# Patient Record
Sex: Male | Born: 1935 | Race: White | Hispanic: No | Marital: Married | Smoking: Former smoker
Health system: Southern US, Community
[De-identification: ages and names within clinical notes are randomized; demographics above are authoritative.]

## PROBLEM LIST (undated history)

## (undated) DIAGNOSIS — J309 Allergic rhinitis, unspecified: Secondary | ICD-10-CM

## (undated) DIAGNOSIS — Z720 Tobacco use: Secondary | ICD-10-CM

## (undated) DIAGNOSIS — K219 Gastro-esophageal reflux disease without esophagitis: Secondary | ICD-10-CM

## (undated) DIAGNOSIS — C801 Malignant (primary) neoplasm, unspecified: Secondary | ICD-10-CM

## (undated) DIAGNOSIS — J449 Chronic obstructive pulmonary disease, unspecified: Secondary | ICD-10-CM

## (undated) DIAGNOSIS — N419 Inflammatory disease of prostate, unspecified: Secondary | ICD-10-CM

## (undated) DIAGNOSIS — I2510 Atherosclerotic heart disease of native coronary artery without angina pectoris: Secondary | ICD-10-CM

## (undated) DIAGNOSIS — M1990 Unspecified osteoarthritis, unspecified site: Secondary | ICD-10-CM

## (undated) DIAGNOSIS — L408 Other psoriasis: Secondary | ICD-10-CM

## (undated) DIAGNOSIS — R918 Other nonspecific abnormal finding of lung field: Secondary | ICD-10-CM

## (undated) HISTORY — DX: Unspecified osteoarthritis, unspecified site: M19.90

## (undated) HISTORY — DX: Chronic obstructive pulmonary disease, unspecified: J44.9

## (undated) HISTORY — PX: CHOLECYSTECTOMY: SHX55

## (undated) HISTORY — DX: Gastro-esophageal reflux disease without esophagitis: K21.9

## (undated) HISTORY — PX: OTHER SURGICAL HISTORY: SHX169

## (undated) HISTORY — PX: LUNG BIOPSY: SHX232

## (undated) HISTORY — DX: Inflammatory disease of prostate, unspecified: N41.9

## (undated) HISTORY — DX: Other psoriasis: L40.8

## (undated) HISTORY — DX: Allergic rhinitis, unspecified: J30.9

---

## 1998-11-17 ENCOUNTER — Ambulatory Visit (HOSPITAL_COMMUNITY): Admission: RE | Admit: 1998-11-17 | Discharge: 1998-11-17 | Payer: Self-pay | Admitting: Endocrinology

## 1998-11-17 ENCOUNTER — Encounter: Payer: Self-pay | Admitting: Endocrinology

## 1998-12-01 ENCOUNTER — Ambulatory Visit (HOSPITAL_COMMUNITY): Admission: RE | Admit: 1998-12-01 | Discharge: 1998-12-01 | Payer: Self-pay | Admitting: Gastroenterology

## 1998-12-01 ENCOUNTER — Encounter: Payer: Self-pay | Admitting: Gastroenterology

## 1998-12-14 ENCOUNTER — Other Ambulatory Visit: Admission: RE | Admit: 1998-12-14 | Discharge: 1998-12-14 | Payer: Self-pay | Admitting: Gastroenterology

## 2009-11-22 ENCOUNTER — Ambulatory Visit: Payer: Self-pay | Admitting: Internal Medicine

## 2009-11-22 DIAGNOSIS — K219 Gastro-esophageal reflux disease without esophagitis: Secondary | ICD-10-CM

## 2009-11-22 DIAGNOSIS — R059 Cough, unspecified: Secondary | ICD-10-CM | POA: Insufficient documentation

## 2009-11-22 DIAGNOSIS — R079 Chest pain, unspecified: Secondary | ICD-10-CM | POA: Insufficient documentation

## 2009-11-22 DIAGNOSIS — J309 Allergic rhinitis, unspecified: Secondary | ICD-10-CM | POA: Insufficient documentation

## 2009-11-22 DIAGNOSIS — R05 Cough: Secondary | ICD-10-CM

## 2009-11-22 DIAGNOSIS — J449 Chronic obstructive pulmonary disease, unspecified: Secondary | ICD-10-CM | POA: Insufficient documentation

## 2009-11-22 DIAGNOSIS — R5383 Other fatigue: Secondary | ICD-10-CM

## 2009-11-22 DIAGNOSIS — R5381 Other malaise: Secondary | ICD-10-CM

## 2009-11-22 DIAGNOSIS — L408 Other psoriasis: Secondary | ICD-10-CM

## 2009-11-22 DIAGNOSIS — N410 Acute prostatitis: Secondary | ICD-10-CM

## 2009-11-22 DIAGNOSIS — J4489 Other specified chronic obstructive pulmonary disease: Secondary | ICD-10-CM

## 2009-11-22 HISTORY — DX: Other specified chronic obstructive pulmonary disease: J44.89

## 2009-11-22 HISTORY — DX: Chronic obstructive pulmonary disease, unspecified: J44.9

## 2009-11-22 HISTORY — DX: Allergic rhinitis, unspecified: J30.9

## 2009-11-22 HISTORY — DX: Other psoriasis: L40.8

## 2009-11-22 HISTORY — DX: Gastro-esophageal reflux disease without esophagitis: K21.9

## 2009-11-22 LAB — CONVERTED CEMR LAB
ALT: 16 units/L (ref 0–53)
AST: 22 units/L (ref 0–37)
Albumin: 3.5 g/dL (ref 3.5–5.2)
Alkaline Phosphatase: 82 units/L (ref 39–117)
BUN: 9 mg/dL (ref 6–23)
Basophils Absolute: 0 10*3/uL (ref 0.0–0.1)
Basophils Relative: 0.3 % (ref 0.0–3.0)
Bilirubin, Direct: 0.1 mg/dL (ref 0.0–0.3)
CO2: 33 meq/L — ABNORMAL HIGH (ref 19–32)
Calcium: 9.2 mg/dL (ref 8.4–10.5)
Chloride: 105 meq/L (ref 96–112)
Cholesterol: 172 mg/dL (ref 0–200)
Creatinine, Ser: 0.7 mg/dL (ref 0.4–1.5)
Eosinophils Absolute: 0 10*3/uL (ref 0.0–0.7)
Eosinophils Relative: 0.5 % (ref 0.0–5.0)
Folate: >20 ng/mL
GFR calc non Af Amer: 115.34 mL/min (ref >60)
Glucose, Bld: 82 mg/dL (ref 70–99)
HCT: 41.2 % (ref 39.0–52.0)
HDL: 72 mg/dL (ref >39.00)
Hemoglobin: 13.3 g/dL (ref 13.0–17.0)
Iron: 71 ug/dL (ref 42–165)
LDL Cholesterol: 79 mg/dL (ref 0–99)
Lymphocytes Relative: 21.9 % (ref 12.0–46.0)
Lymphs Abs: 1.4 10*3/uL (ref 0.7–4.0)
MCHC: 32.3 g/dL (ref 30.0–36.0)
MCV: 102 fL — ABNORMAL HIGH (ref 78.0–100.0)
Monocytes Absolute: 0.7 10*3/uL (ref 0.1–1.0)
Monocytes Relative: 10.6 % (ref 3.0–12.0)
Neutro Abs: 4.4 10*3/uL (ref 1.4–7.7)
Neutrophils Relative %: 66.7 % (ref 43.0–77.0)
PSA: 1.53 ng/mL (ref 0.10–4.00)
Platelets: 199 10*3/uL (ref 150.0–400.0)
Potassium: 4.5 meq/L (ref 3.5–5.1)
RBC: 4.04 M/uL — ABNORMAL LOW (ref 4.22–5.81)
RDW: 12.7 % (ref 11.5–14.6)
Saturation Ratios: 20.7 % (ref 20.0–50.0)
Sed Rate: 32 mm/hr — ABNORMAL HIGH (ref 0–22)
Sodium: 141 meq/L (ref 135–145)
TSH: 1.05 microintl units/mL (ref 0.35–5.50)
Total Bilirubin: 0.3 mg/dL (ref 0.3–1.2)
Total CHOL/HDL Ratio: 2
Total Protein: 7.6 g/dL (ref 6.0–8.3)
Transferrin: 245.5 mg/dL (ref 212.0–360.0)
Triglycerides: 104 mg/dL (ref 0.0–149.0)
VLDL: 20.8 mg/dL (ref 0.0–40.0)
Vitamin B-12: 395 pg/mL (ref 211–911)
WBC: 6.5 10*3/uL (ref 4.5–10.5)

## 2009-11-23 ENCOUNTER — Encounter: Payer: Self-pay | Admitting: Internal Medicine

## 2009-11-23 DIAGNOSIS — R918 Other nonspecific abnormal finding of lung field: Secondary | ICD-10-CM

## 2009-11-23 LAB — CONVERTED CEMR LAB: Vit D, 25-Hydroxy: 29 ng/mL — ABNORMAL LOW (ref 30–89)

## 2009-11-24 ENCOUNTER — Ambulatory Visit: Payer: Self-pay | Admitting: Internal Medicine

## 2009-11-24 ENCOUNTER — Telehealth (INDEPENDENT_AMBULATORY_CARE_PROVIDER_SITE_OTHER): Payer: Self-pay | Admitting: *Deleted

## 2009-11-28 ENCOUNTER — Ambulatory Visit: Payer: Self-pay

## 2009-11-28 ENCOUNTER — Encounter (HOSPITAL_COMMUNITY): Admission: RE | Admit: 2009-11-28 | Discharge: 2010-02-08 | Payer: Self-pay | Admitting: Internal Medicine

## 2009-11-28 ENCOUNTER — Ambulatory Visit: Payer: Self-pay | Admitting: Internal Medicine

## 2009-12-21 ENCOUNTER — Ambulatory Visit: Payer: Self-pay | Admitting: Internal Medicine

## 2010-10-10 NOTE — Assessment & Plan Note (Signed)
Summary: NEW/ MEDICARE/ [employer] /[place]   #   Vital Signs:  Patient profile:   77 year old male Height:      70 inches Weight:      142.25 pounds BMI:     20.48 O2 Sat:      99 % on Room air Temp:     97.4 degrees F oral Pulse rate:   62 / minute BP sitting:   160 / 74  (left arm) Cuff size:   regular  Vitals Entered ByZella Ball Ewing (November 22, 2009 9:53 AM)  O2 Flow:  Room air  Preventive Care Screening     declines colonoscopy, flu shot  CC: New Pt. New Medicare/RE   CC:  New Pt. New Medicare/RE.  History of Present Illness: last seen here 2005 per dr Everardo All, for the last 5 yrs with occasional only up until this yr - sees dermatologist on reg basis but now Dr Lonni Fix retired;    had mild bronchitis in Feb, still mild some residual cough, no blood or color; also with some recent night sweats trying to get over the cold he thinks;  Pt denies CP, or owrsening, wheezing, orthopnea, pnd, worsening LE edema, palps, dizziness or syncope ., but has had some sob/doe worse since the "cold' last month.  Walks on a treadmill 10 - 15 min 3 days per wk still, rest of thge day with wt machines;   used to walk 30 min but found it boring, and more recetnly with the fatigue and sob.    Here for wellness Diet: Heart Healthy or DM if diabetic Physical Activities: Sedentary Depression/mood screen: Negative Hearing: Intact bilateral Visual Acuity: Grossly normal, see optho yearly ADL's: Capable  Fall Risk: None Home Safety: Good End-of-Life Planning: Advance directive - Full code/I agree   Preventive Screening-Counseling & Management  Alcohol-Tobacco     Smoking Status: current      Drug Use:  no.    Problems Prior to Update: 1)  Abnormal Chest Xray  (ICD-793.1) 2)  Special Screening Malig Neoplasms Other Sites  (ICD-V76.49) 3)  Fatigue  (ICD-780.79) 4)  Chest Pain  (ICD-786.50) 5)  Cough  (ICD-786.2) 6)  Allergic Rhinitis  (ICD-477.9) 7)  Gerd  (ICD-530.81) 8)  Prostatitis, Acute   (ICD-601.0) 9)  Psoriasis  (ICD-696.1) 10)  COPD  (ICD-496)  Medications Prior to Update: 1)  None  Current Medications (verified): 1)  Clobetasol Propionate E 0.05 % Crea (Clobetasol Prop Emollient Base) .... Use Asd 2)  Fluticasone Propionate 0.05 % Crea (Fluticasone Propionate) .... Use Asd 3)  Clindamycin Phosphate 1 % Foam (Clindamycin Phosphate) .... Use Asd 4)  Alavert 10 Mg Tabs (Loratadine) .Marland Kitchen.. 1po Once Daily 5)  Aspir-Low 81 Mg Tbec (Aspirin) .Marland Kitchen.. 1po Once Daily 6)  Proair Hfa 108 (90 Base) Mcg/act Aers (Albuterol Sulfate) .... 2 Puffs Four Times Per Day As Needed 7)  Spiriva Handihaler 18 Mcg Caps (Tiotropium Bromide Monohydrate) .Marland Kitchen.. 1 Puff Once Daily  Allergies (verified): No Known Drug Allergies  Past History:  Family History: Last updated: 11/22/2009 mother with arthritis father with heart disease /MI   Social History: Last updated: 11/22/2009 retired - [employer] 1995 Married 1 child Current Smoker - 6-7 cigars per day Alcohol use-no Drug use-no  Risk Factors: Smoking Status: current (11/22/2009)  Past Medical History: COPD psoriasis  DJD (no psoriatic arthritis per pt); hx of left knee cortisone at [facility]  hx of prostatitis GERD Allergic rhinitis  Past Surgical History: Cholecystectomy s/p  EGD - Dr Russella Dar - with esophageal nodule - normal tissue, 2000 normal excercise cardiolite 1999 s/p urtheral stricture dilation - per urology - Dr Alto [place] 1984 Inguinal herniorrhaphy - bilat  Family History: mother with arthritis father with heart disease /MI   Social History: Reviewed history and no changes required. retired - [employer] 1995 Married 1 child Current Smoker - 6-7 cigars per day Alcohol use-no Drug use-no Smoking Status:  current Drug Use:  no  Review of Systems  The patient denies anorexia, fever, vision loss, decreased hearing, hoarseness, syncope, peripheral edema, prolonged cough, headaches, hemoptysis, abdominal pain,  melena, hematochezia, severe indigestion/heartburn, hematuria, muscle weakness, suspicious skin lesions, transient blindness, difficulty walking, depression, unusual weight change, abnormal bleeding, enlarged lymph nodes, and angioedema.         all otherwise negative per pt -  except for chest  pain to the left chest ? relieved with omeprazole, but also wtih radation to the left chest , ? assoc with SOB, but no n/v, diaphroesis, palp or syncope  Physical Exam  General:  alert and well-developed.   Head:  normocephalic and atraumatic.   Eyes:  vision grossly intact, pupils equal, and pupils round.   Ears:  R ear normal and L ear normal.   Nose:  no external deformity and no nasal discharge.   Mouth:  no gingival abnormalities and pharynx pink and moist.   Neck:  supple and no masses.   Lungs:  normal respiratory effort, R decreased breath sounds, and L decreased breath sounds.   Heart:  normal rate and regular rhythm.   Abdomen:  soft, non-tender, and normal bowel sounds.   Msk:  no joint tenderness and no joint swelling.   Extremities:  no edema, no erythema  Neurologic:  alert & oriented X3 and cranial nerves II-XII intact.     Impression & Recommendations:  Problem # 1:  Preventive Health Care (ICD-V70.0)  Overall doing well, age appropriate education and counseling updated and referral for appropriate preventive services done unless declined, immunizations up to date or declined, diet counseling done if overweight, urged to quit smoking if smokes , most recent labs reviewed and current ordered if appropriate, ecg reviewed or declined (interpretation per ECG scanned in the EMR if done); information regarding Medicare Prevention requirements given if appropriate   Orders: First annual wellness visit with prevention plan  (Z6109)  Problem # 2:  PSORIASIS (ICD-696.1)  refer dermatology  -  pt wil self refer (? dr Terri Piedra or other)  Problem # 3:  COUGH (ICD-786.2)  prob post  bronchitis - to check cxr - OTC sympt meds as needed for tx  Orders: T-2 View CXR, Same Day (71020.5TC)  Problem # 4:  COPD (ICD-496)  trial tx with proair hfa, and spiriva ; for PFT's to re-assess, urged to quit smoking  His updated medication list for this problem includes:    Proair Hfa 108 (90 Base) Mcg/act Aers (Albuterol sulfate) .Marland Kitchen... 2 puffs four times per day as needed    Spiriva Handihaler 18 Mcg Caps (Tiotropium bromide monohydrate) .Marland Kitchen... 1 puff once daily  Orders: Misc. Referral (Misc. Ref) Prescription Created Electronically [id number])  Problem # 5:  FATIGUE (ICD-780.79)  exam o/w benign, to check labs below; follow with expectant management   Orders: T-Vitamin D (25-Hydroxy) (09811-91478) TLB-BMP (Basic Metabolic Panel-BMET) (80048-METABOL) TLB-CBC Platelet - w/Differential (85025-CBCD) TLB-Hepatic/Liver Function Pnl (80076-HEPATIC) TLB-TSH (Thyroid Stimulating Hormone) (84443-TSH) TLB-Sedimentation Rate (ESR) (85652-ESR) TLB-IBC Pnl (Iron/FE;Transferrin) (83550-IBC) TLB-B12 + Folate Pnl (29562_13086-V78/ION)  Problem # 6:  CHEST PAIN (ICD-786.50)  Orders: Cardiolite (Cardiolite) TLB-Lipid Panel (80061-LIPID)  atypical, for stress CL- r/o ischemia given age and RF's  Complete Medication List: 1)  Clobetasol Propionate E 0.05 % Crea (Clobetasol prop emollient base) .... Use asd 2)  Fluticasone Propionate 0.05 % Crea (Fluticasone propionate) .... Use asd 3)  Clindamycin Phosphate 1 % Foam (Clindamycin phosphate) .... Use asd 4)  Alavert 10 Mg Tabs (Loratadine) .Marland Kitchen.. 1po once daily 5)  Aspir-low 81 Mg Tbec (Aspirin) .Marland Kitchen.. 1po once daily 6)  Proair Hfa 108 (90 Base) Mcg/act Aers (Albuterol sulfate) .... 2 puffs four times per day as needed 7)  Spiriva Handihaler 18 Mcg Caps (Tiotropium bromide monohydrate) .Marland Kitchen.. 1 puff once daily  Other Orders: TD Toxoids IM 7 YR + (16109) Pneumococcal Vaccine (60454) Admin 1st Vaccine (09811) Admin of Any Addtl Vaccine  (91478) TLB-PSA (Prostate Specific Antigen) (84153-PSA)  Patient Instructions: 1)  you had the tetanus and pneumonia shots today 2)  Please go to Radiology in the basement level for your X-Ray today  3)  Your EKG was ok today 4)  Please go to the Lab in the basement for your blood and/or urine tests today  5)  You will be contacted about the referral(s) to: Stress test, and Lung testing (PFT's) 6)  Please take all new medications as prescribed  - the proair is for as needed use only, and the spiriva should be evev 7)  Please schedule a follow-up appointment in 1 month. Prescriptions: SPIRIVA HANDIHALER 18 MCG CAPS (TIOTROPIUM BROMIDE MONOHYDRATE) 1 puff once daily  #30 x 11   Entered and Authorized by:   Corwin Levins MD   Signed by:   Corwin Levins MD on 11/22/2009   Method used:   Print then Give to Patient   RxID:   [id number] PROAIR HFA 108 (90 BASE) MCG/ACT AERS (ALBUTEROL SULFATE) 2 puffs four times per day as needed  #1 x 11   Entered and Authorized by:   Corwin Levins MD   Signed by:   Corwin Levins MD on 11/22/2009   Method used:   Print then Give to Patient   RxID:   [id number]    Immunizations Administered:  Tetanus Vaccine:    Vaccine Type: Td    Site: right deltoid    Mfr: GlaxoSmithKline    Dose: 0.5 ml    Route: IM    Given by: Zella Ball Ewing    Exp. Date: 07/26/2011    Lot #: [id number]    VIS given: 07/29/07 version given November 22, 2009.  Pneumonia Vaccine:    Vaccine Type: Pneumovax    Site: left deltoid    Mfr: Merck    Dose: 0.5 ml    Route: IM    Given by: Robin Ewing    Exp. Date: 04/24/2011    Lot #: [id number]    VIS given: 04/07/96 version given November 22, 2009.

## 2010-10-10 NOTE — Assessment & Plan Note (Signed)
Summary: Cardiology Nuclear Study  Nuclear Med Background Indications for Stress Test: Evaluation for Ischemia   History: COPD, Emphysema, Myocardial Perfusion Study  History Comments: '99 ZOX:WRUEAV, EF=71%  Symptoms: Chest Pain, Chest Tightness, Dizziness, DOE, Fatigue  Symptoms Comments: Last episode of [id number]:[id number].   Nuclear Pre-Procedure Cardiac Risk Factors: Family History - CAD, Smoker Caffeine/Decaff Intake: None NPO After: 10:30 PM Lungs: Slightly diminished, but clear.  O2 Sat 100%. IV 0.9% NS with Angio Cath: 20g     IV Site: (R) AC IV Started by: Irean Hong RN Chest Size (in) 40     Height (in): 70 Weight (lb): 140 BMI: 20.16  Nuclear Med Study 1 or 2 day study:  1 day     Stress Test Type:  Stress Reading MD:  Dietrich Pates, MD     Referring MD:  Oliver Barre, MD Resting Radionuclide:  Technetium 79m Tetrofosmin     Resting Radionuclide Dose:  11.0 mCi  Stress Radionuclide:  Technetium 35m Tetrofosmin     Stress Radionuclide Dose:  33.0 mCi   Stress Protocol Exercise Time (min):  9:00 min     Max HR:  122 bpm     Predicted Max HR:  141 bpm  Max Systolic BP: 190 mm Hg     Percent Max HR:  86.52 %     METS: 10.4 Rate Pressure Product:  29562    Stress Test Technologist:  Rea [employer] CMA-N     Nuclear Technologist:  Domenic Polite CNMT  Rest Procedure  Myocardial perfusion imaging was performed at rest 45 minutes following the intravenous administration of Myoview Technetium 31m Tetrofosmin.  Stress Procedure  The patient exercised for nine minutes.  The patient stopped due to fatigue and denied any chest pain.  There were no diagnostic ST-T wave changes.  There were occasional PAC's with brief runs of SVT.  Myoview was injected at peak exercise and myocardial perfusion imaging was performed after a brief delay.  QPS Raw Data Images:  Soft tissue (diaphragm, bowel activity) underlie heart. Stress Images:  Normal perfusion with mild apical thinning. Rest  Images:  Minimal improvement in the apex, not significant for ischemia. Transient Ischemic Dilatation:  1.07  (Normal <1.22)  Lung/Heart Ratio:  .27  (Normal <0.45)  Quantitative Gated Spect Images QGS EDV:  97 ml QGS ESV:  34 ml QGS EF:  65 %   Overall Impression  Exercise Capacity: Good exercise capacity. BP Response: Normal blood pressure response. Clinical Symptoms: No chest pain ECG Impression: No significant ST segment change suggestive of ischemia. Overall Impression: Normal stress nuclear study.  Appended Document: Cardiology Nuclear Study LMOPT - labs negative, normal, or stable  - No Acute problem

## 2010-10-10 NOTE — Assessment & Plan Note (Signed)
Summary: 1 MO ROV /NWS  #   Vital Signs:  Patient profile:   77 year old male Height:      71 inches Weight:      145.50 pounds BMI:     20.37 O2 Sat:      98 % on Room air Temp:     98.3 degrees F oral Pulse rate:   58 / minute BP sitting:   116 / 62  (left arm) Cuff size:   regular  Vitals Entered ByZella Ball Ewing (December 21, 2009 2:07 PM)  O2 Flow:  Room air CC: 1 Month ROV/RE   CC:  1 Month ROV/RE.  History of Present Illness: overall doing well, sprivia use does lead to pt subjective improvement in less sob/doe, no new complaints, Pt denies new CP, sob, doe, wheezing, orthopnea, pnd, worsening LE edema, palps, dizziness or syncope   Pt denies new neuro symptoms such as headache, facial or extremity weakness   Problems Prior to Update: 1)  Preventive Health Care  (ICD-V70.0) 2)  Abnormal Chest Xray  (ICD-793.1) 3)  Special Screening Malig Neoplasms Other Sites  (ICD-V76.49) 4)  Fatigue  (ICD-780.79) 5)  Chest Pain  (ICD-786.50) 6)  Cough  (ICD-786.2) 7)  Allergic Rhinitis  (ICD-477.9) 8)  Gerd  (ICD-530.81) 9)  Prostatitis, Acute  (ICD-601.0) 10)  Psoriasis  (ICD-696.1) 11)  COPD  (ICD-496)  Medications Prior to Update: 1)  Clobetasol Propionate E 0.05 % Crea (Clobetasol Prop Emollient Base) .... Use Asd 2)  Fluticasone Propionate 0.05 % Crea (Fluticasone Propionate) .... Use Asd 3)  Clindamycin Phosphate 1 % Foam (Clindamycin Phosphate) .... Use Asd 4)  Alavert 10 Mg Tabs (Loratadine) .Marland Kitchen.. 1po Once Daily 5)  Aspir-Low 81 Mg Tbec (Aspirin) .Marland Kitchen.. 1po Once Daily 6)  Proair Hfa 108 (90 Base) Mcg/act Aers (Albuterol Sulfate) .... 2 Puffs Four Times Per Day As Needed 7)  Spiriva Handihaler 18 Mcg Caps (Tiotropium Bromide Monohydrate) .Marland Kitchen.. 1 Puff Once Daily  Current Medications (verified): 1)  Clobetasol Propionate E 0.05 % Crea (Clobetasol Prop Emollient Base) .... Use Asd 2)  Fluticasone Propionate 0.05 % Crea (Fluticasone Propionate) .... Use Asd 3)  Clindamycin  Phosphate 1 % Foam (Clindamycin Phosphate) .... Use Asd 4)  Alavert 10 Mg Tabs (Loratadine) .Marland Kitchen.. 1po Once Daily 5)  Aspir-Low 81 Mg Tbec (Aspirin) .Marland Kitchen.. 1po Once Daily 6)  Proair Hfa 108 (90 Base) Mcg/act Aers (Albuterol Sulfate) .... 2 Puffs Four Times Per Day As Needed 7)  Spiriva Handihaler 18 Mcg Caps (Tiotropium Bromide Monohydrate) .Marland Kitchen.. 1 Puff Once Daily  Allergies (verified): No Known Drug Allergies  Past History:  Past Medical History: Last updated: 11/22/2009 COPD psoriasis  DJD (no psoriatic arthritis per pt); hx of left knee cortisone at [facility]  hx of prostatitis GERD Allergic rhinitis  Past Surgical History: Last updated: 11/22/2009 Cholecystectomy s/p EGD - Dr Russella Dar - with esophageal nodule - normal tissue, 2000 normal excercise cardiolite 1999 s/p urtheral stricture dilation - per urology - Dr Alto [place] 1984 Inguinal herniorrhaphy - bilat  Social History: Last updated: 11/22/2009 retired - [employer] 1995 Married 1 child Current Smoker - 6-7 cigars per day Alcohol use-no Drug use-no  Risk Factors: Smoking Status: current (11/22/2009)  Review of Systems       all otherwise negative per pt -    Physical Exam  General:  alert and well-developed.   Head:  normocephalic and atraumatic.   Eyes:  vision grossly intact, pupils equal, and pupils round.  Ears:  R ear normal and L ear normal.   Nose:  no external deformity and no nasal discharge.   Mouth:  no gingival abnormalities and pharynx pink and moist.   Neck:  supple and no masses.   Lungs:  normal respiratory effort, R decreased breath sounds, and L decreased breath sounds.  , no wheezing Heart:  normal rate and regular rhythm.   Abdomen:  soft, non-tender, and normal bowel sounds.   Extremities:  no edema, no erythema    Impression & Recommendations:  Problem # 1:  COPD (ICD-496)  His updated medication list for this problem includes:    Proair Hfa 108 (90 Base) Mcg/act Aers (Albuterol  sulfate) .Marland Kitchen... 2 puffs four times per day as needed    Spiriva Handihaler 18 Mcg Caps (Tiotropium bromide monohydrate) .Marland Kitchen... 1 puff once daily improved symtpoms, has not had to use the proair though he did try it once,  spiriva seems to help;  Continue all previous medications as before this visit   Problem # 2:  FATIGUE (ICD-780.79) somewhat improved;  exam o/w benign;   follow with expectant management   Problem # 3:  CHEST PAIN (ICD-786.50) recent stress test neg, no further symtptoms, follow with expectant management  Complete Medication List: 1)  Clobetasol Propionate E 0.05 % Crea (Clobetasol prop emollient base) .... Use asd 2)  Fluticasone Propionate 0.05 % Crea (Fluticasone propionate) .... Use asd 3)  Clindamycin Phosphate 1 % Foam (Clindamycin phosphate) .... Use asd 4)  Alavert 10 Mg Tabs (Loratadine) .Marland Kitchen.. 1po once daily 5)  Aspir-low 81 Mg Tbec (Aspirin) .Marland Kitchen.. 1po once daily 6)  Proair Hfa 108 (90 Base) Mcg/act Aers (Albuterol sulfate) .... 2 puffs four times per day as needed 7)  Spiriva Handihaler 18 Mcg Caps (Tiotropium bromide monohydrate) .Marland Kitchen.. 1 puff once daily  Patient Instructions: 1)  Continue all previous medications as before this visit  2)  Please schedule a follow-up appointment in march 2012 for yearly exam, or sooner if needed

## 2010-10-10 NOTE — Miscellaneous (Signed)
Summary: Orders Update  Clinical Lists Changes  Problems: Added new problem of ABNORMAL CHEST XRAY (ICD-793.1) Orders: Added new Test order of T-2 View CXR, Same Day (71020.5TC) - Signed

## 2010-10-10 NOTE — Progress Notes (Signed)
Summary: Nuclear Pre-Procedure  Phone Note Outgoing Call   Call placed by: Milana Na, EMT-P,  November 24, 2009 12:49 PM Summary of Call: Reviewed information on Myoview Information Sheet (see scanned document for further details).  Spoke with patient.     Nuclear Med Background Indications for Stress Test: Evaluation for Ischemia   History: COPD, Emphysema, Myocardial Perfusion Study  History Comments: '99 MPS NL EF 71%  03/11 COPD/EMPHYSEMA per CXR  Symptoms: Chest Pain, DOE, Fatigue    Nuclear Pre-Procedure Cardiac Risk Factors: Family History - CAD, Smoker Height (in): 70  Nuclear Med Study Referring MD:  Oliver Barre

## 2010-12-01 ENCOUNTER — Encounter: Payer: Self-pay | Admitting: Internal Medicine

## 2010-12-01 ENCOUNTER — Other Ambulatory Visit (INDEPENDENT_AMBULATORY_CARE_PROVIDER_SITE_OTHER): Payer: Medicare Other

## 2010-12-01 ENCOUNTER — Ambulatory Visit (INDEPENDENT_AMBULATORY_CARE_PROVIDER_SITE_OTHER): Payer: Medicare Other | Admitting: Internal Medicine

## 2010-12-01 DIAGNOSIS — R5383 Other fatigue: Secondary | ICD-10-CM

## 2010-12-01 DIAGNOSIS — J449 Chronic obstructive pulmonary disease, unspecified: Secondary | ICD-10-CM

## 2010-12-01 DIAGNOSIS — R42 Dizziness and giddiness: Secondary | ICD-10-CM

## 2010-12-01 DIAGNOSIS — R5381 Other malaise: Secondary | ICD-10-CM

## 2010-12-01 DIAGNOSIS — E559 Vitamin D deficiency, unspecified: Secondary | ICD-10-CM | POA: Insufficient documentation

## 2010-12-01 DIAGNOSIS — Z125 Encounter for screening for malignant neoplasm of prostate: Secondary | ICD-10-CM

## 2010-12-01 DIAGNOSIS — M542 Cervicalgia: Secondary | ICD-10-CM

## 2010-12-01 LAB — BASIC METABOLIC PANEL
CO2: 34 mEq/L — ABNORMAL HIGH (ref 19–32)
GFR: 109.61 mL/min (ref >60.00)
Glucose, Bld: 81 mg/dL (ref 70–99)
Potassium: 4.5 mEq/L (ref 3.5–5.1)
Sodium: 141 mEq/L (ref 135–145)

## 2010-12-01 LAB — CBC WITH DIFFERENTIAL/PLATELET
Eosinophils Absolute: 0 10*3/uL (ref 0.0–0.7)
MCHC: 33.7 g/dL (ref 30.0–36.0)
MCV: 101.1 fl — ABNORMAL HIGH (ref 78.0–100.0)
Monocytes Absolute: 0.6 10*3/uL (ref 0.1–1.0)
Neutrophils Relative %: 50.8 % (ref 43.0–77.0)
Platelets: 194 10*3/uL (ref 150.0–400.0)
RDW: 13.7 % (ref 11.5–14.6)
WBC: 5.6 10*3/uL (ref 4.5–10.5)

## 2010-12-01 LAB — HEPATIC FUNCTION PANEL
Bilirubin, Direct: 0.1 mg/dL (ref 0.0–0.3)
Total Bilirubin: 0.4 mg/dL (ref 0.3–1.2)

## 2010-12-01 LAB — TSH: TSH: 0.64 u[IU]/mL (ref 0.35–5.50)

## 2010-12-01 MED ORDER — TIOTROPIUM BROMIDE MONOHYDRATE 18 MCG IN CAPS: 18.0000 ug | ORAL_CAPSULE | Freq: Every day | RESPIRATORY_TRACT | Status: DC

## 2010-12-01 NOTE — Progress Notes (Signed)
Subjective:    Patient ID: Jon Hicks, male    DOB: 20-Mar-1928, 77 y.o.   MRN: [id number]  HPI here for f/u;  Overall doing ok but does have 2 mo mild to mod pain to the left post/lat neck base - someitmes to the right as well, can occasionally radiate towards the left shoudler blade only;  Worse to turn the head left and right, but no UE pain/weak/numb,  No bowel or bladder change, no gait changes, falls, fever, wt loss, injury. Nothing o/w makes better or worse.    Does also have episodes of momentary dizziness with ambulation and mild fatigue for several months, a few times per wk.  No overt bleeding or bruising.   Pt denies chest pain, increased sob or doe, wheezing, orthopnea, PND, increased LE swelling, palpitations, dizziness or syncope.    Pt denies new neurological symptoms such as new headache, or facial or extremity weakness or numbness.   Pt denies polydipsia, polyuria  Pt states overall good compliance with meds, trying to follow lower cholesterol diet,  wt overall stable but little exercise however.  Uses the Spiriva several times per wk, still smoking and doesn't plan to try to quit.    Pt denies fever, wt loss, night sweats, loss of appetite, or other constitutional symptoms  Overall good compliance with treatment, and good medicine tolerability.  Denies worsening depressive symptoms, suicidal ideation, or panic, though has ongoing anxiety, not increased recently.            Review of Systems Review of Systems  Constitutional: Negative for diaphoresis and unexpected weight change.  HENT: Negative for drooling and tinnitus.   Eyes: Negative for photophobia and visual disturbance.  Respiratory: Negative for choking and stridor.   Gastrointestinal: Negative for vomiting and blood in stool.  Genitourinary: Negative for hematuria and decreased urine volume.  Musculoskeletal: Negative for gait problem.  Skin: Negative for color change and wound.  Neurological: Negative for  tremors and numbness.  Psychiatric/Behavioral: Negative for decreased concentration. The patient is not hyperactive.    Past Medical History  Diagnosis Date  . COPD (chronic obstructive pulmonary disease)   . Psoriasis   . DJD (degenerative joint disease)     (no psoriatic arthritis per pt.); hx of left knee [facility] at [facility]  . Prostatitis     hx  . GERD (gastroesophageal reflux disease)   . Allergic rhinitis    Past Surgical History  Procedure Date  . Cholecystectomy   . Edg     Dr. Rhea Belton esophageal nodule-normal tissue, 2000  . Cardiolite     normal exercise cardiolite 1999  . Urtheral     s/p urtheral stricture dilation-per urology-Dr. Alto [place] 1984  . Herniorrhapy     inguinal herniorrhapy-bilat    reports that he has been smoking Cigars.  He does not have any smokeless tobacco history on file. He reports that he does not drink alcohol or use illicit drugs. family history includes Arthritis in his mother and Heart disease in his father. Allergies not on file     Objective:   Physical Exam   Physical Exam  Constitutional: Pt appears well-developed and well-nourished.  HENT: Head: Normocephalic.  Right Ear: External ear normal.  Left Ear: External ear normal.  Eyes: Conjunctivae and EOM are normal. Pupils are equal, round, and reactive to light.  Neck: Normal range of motion. Neck supple.  Cardiovascular: Normal rate and regular rhythm.   Pulmonary/Chest: Effort normal and breath sounds mild decreased  bilat.  Abd:  Soft, NT, non-distended, + BS Neurological: Pt is alert. No cranial nerve deficit.  motor - normal Skin: Skin is warm. No erythema.  Psychiatric: Pt behavior is normal. Thought content normal.   2+ anxoius MSK:  Neck with somewhat exagerrated lordosis, o/w nontender   Assessment & Plan:

## 2010-12-01 NOTE — Patient Instructions (Signed)
Take all new medications as prescribed - the tylenol arthritis as needed Continue all other medications as before Please go to LAB in the Basement for the blood and/or urine tests to be done today Please call the number on the [facility] Card for results in 2-3 days You will be contacted regarding the referral for: echocardiogram, and carotid doppler tests to check for blockage

## 2010-12-01 NOTE — Assessment & Plan Note (Addendum)
Suspect some element of geriatric decline;  Ok for lab evaluation, but no evidence for mood d/o or daytime somnolence or other to explain;  Exam otherwise benign, to check labs as documented, follow with expectant management

## 2010-12-01 NOTE — Assessment & Plan Note (Addendum)
.  stable overall by hx and exam, ok to continue meds/tx as is; urged to quit smoking

## 2010-12-01 NOTE — Assessment & Plan Note (Signed)
Hx and exam c/w prob underlying c-spine DJD/DDD;  For tylenol arthritis OTC prn,  Consider films but pt declines at this time

## 2010-12-01 NOTE — Assessment & Plan Note (Signed)
Unclear etiology;  Will check labs  , as well as echo and carotid dopplers, and ecg  (note stress neg mar 2011)

## 2010-12-06 ENCOUNTER — Encounter (INDEPENDENT_AMBULATORY_CARE_PROVIDER_SITE_OTHER): Payer: Medicare Other | Admitting: Cardiology

## 2010-12-06 ENCOUNTER — Other Ambulatory Visit: Payer: Self-pay | Admitting: Internal Medicine

## 2010-12-06 DIAGNOSIS — R42 Dizziness and giddiness: Secondary | ICD-10-CM

## 2010-12-06 DIAGNOSIS — I6529 Occlusion and stenosis of unspecified carotid artery: Secondary | ICD-10-CM

## 2010-12-12 ENCOUNTER — Encounter: Payer: Self-pay | Admitting: Internal Medicine

## 2010-12-14 ENCOUNTER — Encounter: Payer: Self-pay | Admitting: Internal Medicine

## 2010-12-18 ENCOUNTER — Ambulatory Visit (HOSPITAL_COMMUNITY): Payer: Medicare Other | Attending: Internal Medicine | Admitting: Radiology

## 2010-12-18 DIAGNOSIS — J449 Chronic obstructive pulmonary disease, unspecified: Secondary | ICD-10-CM | POA: Insufficient documentation

## 2010-12-18 DIAGNOSIS — R5383 Other fatigue: Secondary | ICD-10-CM | POA: Insufficient documentation

## 2010-12-18 DIAGNOSIS — R5381 Other malaise: Secondary | ICD-10-CM | POA: Insufficient documentation

## 2010-12-18 DIAGNOSIS — J4489 Other specified chronic obstructive pulmonary disease: Secondary | ICD-10-CM | POA: Insufficient documentation

## 2010-12-18 DIAGNOSIS — I379 Nonrheumatic pulmonary valve disorder, unspecified: Secondary | ICD-10-CM

## 2010-12-18 DIAGNOSIS — R42 Dizziness and giddiness: Secondary | ICD-10-CM

## 2010-12-18 DIAGNOSIS — F17200 Nicotine dependence, unspecified, uncomplicated: Secondary | ICD-10-CM | POA: Insufficient documentation

## 2010-12-18 DIAGNOSIS — I079 Rheumatic tricuspid valve disease, unspecified: Secondary | ICD-10-CM | POA: Insufficient documentation

## 2010-12-18 DIAGNOSIS — I080 Rheumatic disorders of both mitral and aortic valves: Secondary | ICD-10-CM | POA: Insufficient documentation

## 2010-12-26 ENCOUNTER — Telehealth: Payer: Self-pay | Admitting: *Deleted

## 2010-12-26 NOTE — Telephone Encounter (Signed)
Patient requesting results of last tests.

## 2010-12-26 NOTE — Telephone Encounter (Signed)
Left on PT

## 2010-12-27 NOTE — Telephone Encounter (Signed)
Pt advised.

## 2011-06-25 ENCOUNTER — Encounter: Payer: Self-pay | Admitting: Internal Medicine

## 2011-06-25 DIAGNOSIS — Z0000 Encounter for general adult medical examination without abnormal findings: Secondary | ICD-10-CM | POA: Insufficient documentation

## 2011-06-27 ENCOUNTER — Ambulatory Visit (INDEPENDENT_AMBULATORY_CARE_PROVIDER_SITE_OTHER): Payer: Medicare Other | Admitting: Internal Medicine

## 2011-06-27 ENCOUNTER — Encounter: Payer: Self-pay | Admitting: Internal Medicine

## 2011-06-27 VITALS — BP 130/70 | HR 56 | Temp 98.6°F | Ht 67.0 in | Wt 141.0 lb

## 2011-06-27 DIAGNOSIS — R259 Unspecified abnormal involuntary movements: Secondary | ICD-10-CM

## 2011-06-27 DIAGNOSIS — R29818 Other symptoms and signs involving the nervous system: Secondary | ICD-10-CM

## 2011-06-27 DIAGNOSIS — R251 Tremor, unspecified: Secondary | ICD-10-CM | POA: Insufficient documentation

## 2011-06-27 DIAGNOSIS — R42 Dizziness and giddiness: Secondary | ICD-10-CM

## 2011-06-27 DIAGNOSIS — R519 Headache, unspecified: Secondary | ICD-10-CM | POA: Insufficient documentation

## 2011-06-27 DIAGNOSIS — R2689 Other abnormalities of gait and mobility: Secondary | ICD-10-CM | POA: Insufficient documentation

## 2011-06-27 DIAGNOSIS — R51 Headache: Secondary | ICD-10-CM | POA: Insufficient documentation

## 2011-06-27 NOTE — Patient Instructions (Signed)
Continue all other medications as before You will be contacted regarding the referral for: MRI for the brain, and Neurology (Dr Modesto Charon)

## 2011-06-28 ENCOUNTER — Encounter: Payer: Self-pay | Admitting: Internal Medicine

## 2011-06-28 NOTE — Assessment & Plan Note (Signed)
Etiology unclear, had fairly recent stress test/echo, consider carotids

## 2011-06-28 NOTE — Assessment & Plan Note (Signed)
Most likely referred pain from ongoing 2-3 mo flare of suspected underlying c-spine DJD or DDD vs occipital neuralgia, pain mild, declines further tx at this time

## 2011-06-28 NOTE — Progress Notes (Signed)
Subjective:    Patient ID: Jon Hicks, male    DOB: 1928/04/13, 76 y.o.   MRN: [id number]  HPI  Here with c/o ongoing mild post neck discomfort, worst at the base of the neck, radiates to the bilat occiput and sometimes the crown, worse to flex the neck, but without recent change in severity, bowel or bladder change, fever, wt loss,  worsening LE pain/numbness/weakness, or falls. Has been assoc more recently it seems with dizziness, off balance, some gait difficulty and has to hold on to the wall of the shower sometimes when bathing. Has increased tremors to hand and arms as well. No blurred vision.  Pt denies chest pain, increased sob or doe, wheezing, orthopnea, PND, increased LE swelling, palpitations, dizziness or syncope.   Pt denies polydipsia, polyuria.  Pt states overall good compliance with meds, trying to follow lower cholesterol diet, wt overall stable but little exercise however.    Pt denies fever, wt loss, night sweats, loss of appetite, or other constitutional symptoms Past Medical History  Diagnosis Date  . COPD (chronic obstructive pulmonary disease)   . Psoriasis   . DJD (degenerative joint disease)     (no psoriatic arthritis per pt.); hx of left knee cortisone at [facility]  . Prostatitis     hx  . GERD (gastroesophageal reflux disease)   . Allergic rhinitis   . ALLERGIC RHINITIS 11/22/2009  . COPD 11/22/2009  . GERD 11/22/2009  . PSORIASIS 11/22/2009  . Vitamin D deficiency 12/01/2010   Past Surgical History  Procedure Date  . Cholecystectomy   . Edg     Dr. Rhea Belton esophageal nodule-normal tissue, 2000  . Cardiolite     normal exercise cardiolite 1999  . Urtheral     s/p urtheral stricture dilation-per urology-Dr. Alto Denver 1984  . Herniorrhapy     inguinal herniorrhapy-bilat    reports that he has been smoking Cigars.  He does not have any smokeless tobacco history on file. He reports that he does not drink alcohol or use illicit drugs. family history  includes Arthritis in his mother and Heart disease in his father. No Known Allergies Current Outpatient Prescriptions on File Prior to Visit  Medication Sig Dispense Refill  . albuterol (PROAIR HFA) 108 (90 BASE) MCG/ACT inhaler Inhale 2 puffs into the lungs every 4 (four) hours as needed.        Marland Kitchen aspirin 81 MG tablet Take 81 mg by mouth daily.        . Clindamycin Phosphate foam Apply topically 2 (two) times daily.        . clobetasol (TEMOVATE) 0.05 % cream Apply topically as directed.        . loratadine (CLARITIN) 10 MG tablet Take 10 mg by mouth daily.        Marland Kitchen tiotropium (SPIRIVA HANDIHALER) 18 MCG inhalation capsule Place 1 capsule (18 mcg total) into inhaler and inhale daily.  30 capsule  11   Review of Systems Review of Systems  Constitutional: Negative for diaphoresis and unexpected weight change.  HENT: Negative for drooling and tinnitus.   Eyes: Negative for photophobia and visual disturbance.  Respiratory: Negative for choking and stridor.   Gastrointestinal: Negative for vomiting and blood in stool.  Genitourinary: Negative for hematuria and decreased urine volume.     Objective:   Physical Exam BP 130/70  Pulse 56  Temp(Src) 98.6 F (37 C) (Oral)  Ht 5\' 7"  (1.702 m)  Wt 141 lb (63.957 kg)  BMI 22.08  kg/m2  SpO2 99% Physical Exam  VS noted Constitutional: Pt appears well-developed and well-nourished.  HENT: Head: Normocephalic.  Right Ear: External ear normal.  Left Ear: External ear normal.  Eyes: Conjunctivae and EOM are normal. Pupils are equal, round, and reactive to light.  Neck: Normal range of motion. Neck supple.  Cardiovascular: Normal rate and regular rhythm.   Pulmonary/Chest: Effort normal and breath sounds normal.  Abd:  Soft, NT, non-distended, + BS Neurological: Pt is alert. No cranial nerve deficit. motor 5/5, gait some wide based, mild tremor UE's noted Skin: Skin is warm. No erythema.  Psychiatric: Pt behavior is normal. Thought content  normal.  Neck supple without tenderness, spasm    Assessment & Plan:

## 2011-06-28 NOTE — Assessment & Plan Note (Signed)
Etiology unclear - ? Benign vs other such as parkinson related, for MRI/neuro

## 2011-06-28 NOTE — Assessment & Plan Note (Signed)
Etiology unclear, for Head MRI, refer to neuro - ? Early parkinsons

## 2011-07-02 ENCOUNTER — Ambulatory Visit
Admission: RE | Admit: 2011-07-02 | Discharge: 2011-07-02 | Disposition: A | Discharge status: Home / Self Care | Payer: Medicare Other | Source: Ambulatory Visit | Attending: Internal Medicine | Admitting: Internal Medicine

## 2011-07-02 DIAGNOSIS — R251 Tremor, unspecified: Secondary | ICD-10-CM

## 2011-07-02 DIAGNOSIS — R51 Headache: Secondary | ICD-10-CM

## 2011-07-10 ENCOUNTER — Encounter: Payer: Self-pay | Admitting: Neurology

## 2011-07-10 ENCOUNTER — Ambulatory Visit (INDEPENDENT_AMBULATORY_CARE_PROVIDER_SITE_OTHER): Payer: Medicare Other | Admitting: Neurology

## 2011-07-10 ENCOUNTER — Other Ambulatory Visit (INDEPENDENT_AMBULATORY_CARE_PROVIDER_SITE_OTHER): Payer: Medicare Other

## 2011-07-10 DIAGNOSIS — R7309 Other abnormal glucose: Secondary | ICD-10-CM

## 2011-07-10 DIAGNOSIS — G609 Hereditary and idiopathic neuropathy, unspecified: Secondary | ICD-10-CM

## 2011-07-10 LAB — TSH: TSH: 1.12 u[IU]/mL (ref 0.35–5.50)

## 2011-07-10 LAB — COMPREHENSIVE METABOLIC PANEL WITH GFR
ALT: 16 U/L (ref 0–53)
AST: 22 U/L (ref 0–37)
Albumin: 3.6 g/dL (ref 3.5–5.2)
Alkaline Phosphatase: 82 U/L (ref 39–117)
BUN: 11 mg/dL (ref 6–23)
CO2: 31 meq/L (ref 19–32)
Calcium: 9.4 mg/dL (ref 8.4–10.5)
Chloride: 104 meq/L (ref 96–112)
Creatinine, Ser: 0.7 mg/dL (ref 0.4–1.5)
GFR: 113.01 mL/min (ref >60.00)
Glucose, Bld: 97 mg/dL (ref 70–99)
Potassium: 5 meq/L (ref 3.5–5.1)
Sodium: 141 meq/L (ref 135–145)
Total Bilirubin: 0.3 mg/dL (ref 0.3–1.2)
Total Protein: 7.4 g/dL (ref 6.0–8.3)

## 2011-07-10 LAB — CBC WITH DIFFERENTIAL/PLATELET
Basophils Absolute: 0 10*3/uL (ref 0.0–0.1)
Eosinophils Absolute: 0 10*3/uL (ref 0.0–0.7)
Lymphocytes Relative: 28 % (ref 12.0–46.0)
MCHC: 32.8 g/dL (ref 30.0–36.0)
Neutrophils Relative %: 60 % (ref 43.0–77.0)
RDW: 14.3 % (ref 11.5–14.6)

## 2011-07-10 LAB — HEMOGLOBIN A1C: Hgb A1c MFr Bld: 5.8 % (ref 4.6–6.5)

## 2011-07-10 MED ORDER — CYCLOBENZAPRINE HCL 5 MG PO TABS: 5.0000 mg | ORAL_TABLET | Freq: Three times a day (TID) | ORAL | Status: AC | PRN

## 2011-07-10 MED ORDER — CYCLOBENZAPRINE HCL 5 MG PO TABS: 5.0000 mg | ORAL_TABLET | Freq: Three times a day (TID) | ORAL | Status: DC | PRN

## 2011-07-10 NOTE — Progress Notes (Signed)
Dear Dr. Jonny Ruiz,  Thank you for having me see Jon Hicks in consultation today at [facility] for his multiple complaints.  As you may recall, he is a 77 y.o. year old male with a history of chronic tobacco abuse and psoriasis who complains of a spell of left arm shaking that lasted minutes about 3 weeks ago.  He also complains of chronic neck pain for years that over the last few years has also sometime been accompanied by occipital head pain.  Finally, he has had problems with unsteadiness, particularly when he is in the shower and closes his eyes to wash his hair.  With respect to the arm shaking spell he is uncertain whether he has had these before.  He has bilateral fine tremor at baseline.  This arm shaking spell was coarser and different from his hand shaking.  It lasted a "couple of minutes".  He has had a history of chronic neck pain, worse with movement, that has been attributed to arthritis.  His neck hurts him every day, and the head pain is coming every day as well.  It radiates up the neck and sometimes to the temples.  The head pain has occurred for years as well.  He has never had his neck imaged.  Finally, he feels unsteady on his feet at times.  Sometimes it is a lightheadedness, but at other times it is a an unsteadiness particularly when he closes his eyes.  He has not any falls.  Past Medical History  Diagnosis Date  . COPD (chronic obstructive pulmonary disease)   . Psoriasis   . DJD (degenerative joint disease)     (no psoriatic arthritis per pt.); hx of left knee cortisone at [facility]  . Prostatitis     hx  . GERD (gastroesophageal reflux disease)   . Allergic rhinitis   . ALLERGIC RHINITIS 11/22/2009  . COPD 11/22/2009  . GERD 11/22/2009  . PSORIASIS 11/22/2009  . Vitamin D deficiency 12/01/2010    Past Surgical History  Procedure Date  . Cholecystectomy   . Edg     Dr. Rhea Belton esophageal nodule-normal tissue, 2000  . Cardiolite     normal  exercise cardiolite 1999  . Urtheral     s/p urtheral stricture dilation-per urology-Dr. Alto Denver 1984  . Herniorrhapy     inguinal herniorrhapy-bilat    History   Social History  . Marital Status: Married    Spouse Name: N/A    Number of Children: N/A  . Years of Education: N/A   Occupational History  . Retired     [employer]   Social History Main Topics  . Smoking status: Current Everyday Smoker    Types: Cigars  . Smokeless tobacco: Never Used  . Alcohol Use: No  . Drug Use: No  . Sexually Active: None   Other Topics Concern  . None   Social History Narrative  . None    Family History  Problem Relation Age of Onset  . Arthritis Mother   . Heart disease Father     MI      ROS:  13 systems were reviewed and are notable for shortness of breath, nausea, vomiting and abdominal pain.  He also complains of arm and leg pain and weakness at times.  All other review of systems are unremarkable.   Examination:  Filed Vitals:   07/10/11 0930  BP: 140/68  Pulse: 56  Height: 5' 8.5" (1.74 m)  Weight: 141 lb (63.957  kg)     In general, thin appearing man with tobacco stained teeth and hands.  Cardiovascular: The patient has a regular rate and rhythm and no carotid bruits.  Fundoscopy:  Disks are flat. Vessel caliber within normal limits.  Mental status:   The patient is oriented to person, place and time. Recent and remote memory are intact. Attention span and concentration are normal. Language including repetition, naming, following commands are intact. Fund of knowledge of current and historical events, as well as vocabulary are normal.  Cranial Nerves: Pupils are equally round and reactive to light. Visual fields full to confrontation. Extraocular movements are intact without nystagmus. Facial sensation and muscles of mastication are intact. Muscles of facial expression are symmetric.Hearing decreased bilaterally to  finger rub. Tongue protrusion, uvula,  palate midline.  Shoulder shrug intact  Motor:  The patient has normal bulk and tone, no pronator drift.  He has a fine postural hand tremor with no significant rigidity.  5/5 muscle strength bilaterally.  Reflexes:   Biceps  Triceps Brachioradialis Knee Ankle  Right 0  0  1   0 0  Left  1+  1+  1+   0 0  Toes down  Coordination:  Normal finger to nose.    Sensation is decreased in a length dependent manner to temperature and vibration. Intact position sense in feet.  Gait and Station are normal.  Tandem gait is slightly impaired.  Romberg is negative  MRI Brain was reviewed.  Cerebellum appears normal, no significant atrophy.  Mild if any white matter disease.  Carotid dopplers done in 11/2010 reveal 0-39% bilateral stenosis.   Impression: Mr. 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  Imbalance with closing eyes - I think this is explained by his sensory loss that is likely secondary to a peripheral neuropathy.  Finally, he spell of arm shaking.  I think this was just an exacerbation of his underlying tremor.  Other possibilities include a limb shaking TIA but this is usually from large vessel disease of the neck which was ruled out with carotid dopplers done in March.  A seizure is also possible but I think less likely.   Recommendations:  1.  Neck pain and head pain - I have prescribed him Flexeril 5mg  tid prn head and neck pain.  If this continues to worsen we will get a MRI C-spine but at this time I think conservative treatment is appropriate.  I don't get a flavor of TA causing his head pain so we will forgo ESR and CRP for now. 2.  Imbalance/?peripheral neuropathy - PN labs, will consider EMG/NCS later if it gets worse. 3.  Limb shaking - will just observe, if they continue we will consider an EEG.     We will see the patient back in 3 months.  Thank you for having [place] see Jon Hicks in  consultation.  Feel free to contact me with any questions.  Lupita Raider Modesto Charon, MD [facility], [place] [address] [place], [place] [address] Phone: [phone] Fax: [phone].

## 2011-07-10 NOTE — Patient Instructions (Signed)
Your prescription has been sent to your pharmacy.  Dr. Modesto Charon wants to see you in three months.  We will call you with your follow up appointment about a month before hand.  Have a wonderful holiday season!!

## 2011-07-12 LAB — PROTEIN ELECTROPHORESIS, SERUM
Alpha-2-Globulin: 10.7 % (ref 7.1–11.8)
Beta 2: 4 % (ref 3.2–6.5)
Beta Globulin: 5.6 % (ref 4.7–7.2)
Gamma Globulin: 23.7 % — ABNORMAL HIGH (ref 11.1–18.8)
M-Spike, %: 1.11 g/dL

## 2011-07-16 ENCOUNTER — Other Ambulatory Visit: Payer: Self-pay | Admitting: Neurology

## 2011-07-18 LAB — IMMUNOFIXATION ELECTROPHORESIS
IgG (Immunoglobin G), Serum: 1860 mg/dL — ABNORMAL HIGH (ref 650–1600)
Total Protein, Serum Electrophoresis: 7.7 g/dL (ref 6.0–8.3)

## 2011-07-23 ENCOUNTER — Telehealth: Payer: Self-pay | Admitting: Internal Medicine

## 2011-07-23 DIAGNOSIS — D472 Monoclonal gammopathy: Secondary | ICD-10-CM

## 2011-07-23 NOTE — Telephone Encounter (Signed)
Patient informed. 

## 2011-07-23 NOTE — Telephone Encounter (Signed)
Jon Hicks pt is found per Dr Modesto Charon to have elevated protein of a specific type, that may indicate an "overactive" bone marrow problem (or at worst even malignancy)      1)  Refer to hematology - I have done  Please notify pt

## 2011-07-31 ENCOUNTER — Telehealth: Payer: Self-pay | Admitting: Oncology

## 2011-07-31 NOTE — Telephone Encounter (Signed)
S/w pt re appt for 11/29 @ 10:30 am w/FS.

## 2011-08-08 ENCOUNTER — Encounter: Payer: Self-pay | Admitting: *Deleted

## 2011-08-08 ENCOUNTER — Other Ambulatory Visit: Payer: Self-pay | Admitting: Oncology

## 2011-08-08 DIAGNOSIS — D729 Disorder of white blood cells, unspecified: Secondary | ICD-10-CM

## 2011-08-09 ENCOUNTER — Ambulatory Visit: Payer: Medicare Other

## 2011-08-09 ENCOUNTER — Telehealth: Payer: Self-pay | Admitting: Oncology

## 2011-08-09 ENCOUNTER — Encounter: Payer: Self-pay | Admitting: *Deleted

## 2011-08-09 ENCOUNTER — Ambulatory Visit (HOSPITAL_COMMUNITY)
Admission: RE | Admit: 2011-08-09 | Discharge: 2011-08-09 | Disposition: A | Discharge status: Home / Self Care | Payer: Medicare Other | Source: Ambulatory Visit | Attending: Oncology | Admitting: Oncology

## 2011-08-09 ENCOUNTER — Ambulatory Visit (HOSPITAL_BASED_OUTPATIENT_CLINIC_OR_DEPARTMENT_OTHER): Payer: Medicare Other | Admitting: Oncology

## 2011-08-09 ENCOUNTER — Other Ambulatory Visit (HOSPITAL_BASED_OUTPATIENT_CLINIC_OR_DEPARTMENT_OTHER): Payer: Medicare Other

## 2011-08-09 VITALS — BP 150/61 | HR 57 | Temp 96.9°F | Ht 69.3 in | Wt 140.6 lb

## 2011-08-09 DIAGNOSIS — IMO0002 Reserved for concepts with insufficient information to code with codable children: Secondary | ICD-10-CM | POA: Insufficient documentation

## 2011-08-09 DIAGNOSIS — J449 Chronic obstructive pulmonary disease, unspecified: Secondary | ICD-10-CM

## 2011-08-09 DIAGNOSIS — D729 Disorder of white blood cells, unspecified: Secondary | ICD-10-CM

## 2011-08-09 DIAGNOSIS — E8809 Other disorders of plasma-protein metabolism, not elsewhere classified: Secondary | ICD-10-CM | POA: Insufficient documentation

## 2011-08-09 DIAGNOSIS — R799 Abnormal finding of blood chemistry, unspecified: Secondary | ICD-10-CM

## 2011-08-09 DIAGNOSIS — L408 Other psoriasis: Secondary | ICD-10-CM

## 2011-08-09 DIAGNOSIS — M5030 Other cervical disc degeneration, unspecified cervical region: Secondary | ICD-10-CM | POA: Insufficient documentation

## 2011-08-09 DIAGNOSIS — M899 Disorder of bone, unspecified: Secondary | ICD-10-CM | POA: Insufficient documentation

## 2011-08-09 DIAGNOSIS — D7289 Other specified disorders of white blood cells: Secondary | ICD-10-CM

## 2011-08-09 LAB — CBC WITH DIFFERENTIAL/PLATELET
BASO%: 0.2 % (ref 0.0–2.0)
HCT: 39.3 % (ref 38.4–49.9)
LYMPH%: 31.9 % (ref 14.0–49.0)
MCH: 34.2 pg — ABNORMAL HIGH (ref 27.2–33.4)
MCHC: 33.9 g/dL (ref 32.0–36.0)
MCV: 101.1 fL — ABNORMAL HIGH (ref 79.3–98.0)
MONO#: 0.6 10*3/uL (ref 0.1–0.9)
NEUT%: 56.5 % (ref 39.0–75.0)
Platelets: 169 10*3/uL (ref 140–400)

## 2011-08-09 NOTE — Telephone Encounter (Signed)
gve the pt his may 2013 appt calendar along with the skeletal bone survey x-ray for today

## 2011-08-09 NOTE — Progress Notes (Signed)
Note Dictated

## 2011-08-09 NOTE — Progress Notes (Signed)
CC:   Jon Levins, MD Jon Meek, MD  REASON FOR CONSULTATION:  Monoclonal protein, rule out plasma cell disorder.  HISTORY OF PRESENT ILLNESS:  [name] is a pleasant 77 year old gentleman, a native of [place], born [place], grew up in [place], been living in [place] for the last 60 some years.  He is a gentleman with significant comorbid conditions that include psoriasis as well as COPD and rather heavy tobacco abuse.  He was referred by Dr. Jonny Hicks to Dr. Modesto Hicks for evaluation for  possible neurological complaints, predominantly neck pain as well as shaking episode of his left arm and possible neuropathy.  The patient at that time had a serum protein electrophoresis done which showed an M spike of about 1.1 g/dL and quantitative immunoglobulin showed an elevated IgG level of 1860, the upper limit of normal was 1600.  He also had a sedimentation rate level that was actually normal at 22.  Vitamin B12 was normal.  CRP was 0.52 normal.  Chemistry showed a creatinine of 0.7, had normal liver function tests, had a GFR about 113.  His hemoglobin was 13, white cell count was 5.8, his MCV slightly elevated 101, normal platelets and normal differential.  The patient was referred to me for evaluation for possible plasma cell disorder.  Upon evaluating Jon Hicks, he reported he still has neck pain.  He used a muscle relaxant that has helped him some, but he had not had any other bony pain.  He had not had any back pain.  He did not report any shoulder pain.  Had not had any hip pain. He did not have any falls.  He did not have any pathological fractures. I could not really elicit any history of any neuropathy that I can see motor or sensory.  He did not have any opportunistic infection.  Not had any hospitalization.  Had not had any signs or symptoms of immune dysregulation.  He is actually a fairly functional 77 year old, lives with his wife and attends to most activities  of daily living.  He does to the gym 3 times a week, able to drive and perform most daily functions.  REVIEW OF SYSTEMS:  He does not report any headaches, blurred vision, double vision.  He does not report any motor or sensory neuropathy.  He does not report any alteration in mental status.  He does not report any psychiatric issues, depression.  He does not report any fever, chills, sweats.  He does not report any cough, hemoptysis, hematemesis.  No nausea or vomiting.  No abdominal pain.  No hematochezia, melena or genitourinary complaints.  The rest of the review of systems is unremarkable.  PAST MEDICAL HISTORY:  Significant as mentioned for heavy tobacco use, COPD, history of psoriasis, history of arthritis, allergic rhinitis, history of GERD, but no history of hypertension, diabetes or coronary artery disease.  He is status post a cholecystectomy and hemorrhoidectomy.  MEDICATION:  Reviewed.  He is currently on albuterol, aspirin, clindamycin foam, Temovate, Flexeril, ibuprofen, Claritin and Spiriva.  ALLERGIES:  None.  SOCIAL HISTORY:  He is married.  He smokes about 7 cigars a day.  Denied any alcohol intake.  Retired from [employer].  He also was in the [employer] during the [place] War.  FAMILY HISTORY:  Really noncontributory.  He does not really know anything about his father's history.  Mother had died of old age.  She might have had a stroke although there is no  clear-cut history of any cancers or blood disorders that Jon Hicks is eliciting.  PHYSICAL EXAMINATION:  Alert, awake gentleman appeared in no active distress.  Vital signs:  Blood pressure 150/61, pulse 57, respirations 20, temperature is 96.9, weight 140 pounds.  ECOG performance status is 1.  HEENT:  Head is normocephalic atraumatic.  Pupils equal, round, reactive to light.  Oral mucosa moist and pink.  Neck:  Supple.  No lymphadenopathy.  Heart:  Regular rate and rhythm.  S1, S2.  Lungs: Clear to  auscultation.  Abdomen:  Soft, nontender.  No hepatosplenomegaly.  Extremities:  No clubbing, cyanosis, or edema. Neurologic:  Intact motor, sensory, and deep tendon reflexes.  LABORATORY DATA:  Again reviewed in the history of present illness.  ASSESSMENT AND PLAN:  This is a pleasant 77 year old gentleman with the following issues. 1. 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     His M spike is about 1 g/dL, IgG level slightly elevated at 1860.     I had a lengthy discussion today discussing the differential     diagnosis with [name].  Differential diagnosis includes plasma     cell disorder such as monoclonal gammopathy of undetermined     significance which is I think the likely diagnosis, multiple     myeloma or amyloidosis are less likely at this point.  I really see     no evidence of end-organ damage such as anemia, renal failure,     hypercalcemia or lytic bony lesions.  However, we will need to     investigate that.  I do not really see anything to suggest     amyloidosis such as renal dysfunction or heart dysfunction or any     cardiac history or congestive heart failure.  Reactive plasma cell     disorder elevation associated with autoimmune disorders or     associated with age is also very likely.  It do not really see any     connection between his neurological complaints, possible     neuropathy, shaking with a plasma cell disorder or monoclonal     gammopathy related to that.  I think these are really unrelated     findings at this time.  In terms of workup, I will obtain serum     light chains today.  Will also obtain a 24 hour urine collection.     I will also obtain a skeletal survey to rule out any lytic lesions.     If all of that is suggesting a monoclonal gammopathy of     undetermined significance, then active surveillance every 6 months     would be the way to go initially and then after that every 12     months after that given the  increased risk of developing multiple     myeloma for somebody with MGUS.  However, if I see anything to     suggest end-organ damage or lytic bony lesions, then obviously will     do a bone marrow biopsy for staging purposes.  All of these aspects     were discussed today in details with [name].  He is agreeable to     this plan and will proceed as follows. 2. History of neck pain.  Again, I anticipate this is probably more     related to musculoskeletal in nature but again we are obtaining x-     rays today to rule out any  bony lesions at this time.    ______________________________ Benjiman Core, M.D. FNS/MEDQ  D:  08/09/2011  T:  08/09/2011  Job:  [id number]

## 2011-08-10 LAB — COMPREHENSIVE METABOLIC PANEL
ALT: 12 U/L (ref 0–53)
AST: 20 U/L (ref 0–37)
Albumin: 3.6 g/dL (ref 3.5–5.2)
BUN: 11 mg/dL (ref 6–23)
CO2: 31 mEq/L (ref 19–32)
Calcium: 8.8 mg/dL (ref 8.4–10.5)
Chloride: 103 mEq/L (ref 96–112)
Creatinine, Ser: 0.73 mg/dL (ref 0.50–1.35)
Potassium: 4.1 mEq/L (ref 3.5–5.3)

## 2011-08-10 LAB — KAPPA/LAMBDA LIGHT CHAINS
Kappa free light chain: 1.36 mg/dL (ref 0.33–1.94)
Kappa:Lambda Ratio: 1.53 (ref 0.26–1.65)

## 2011-08-15 LAB — UIFE/LIGHT CHAINS/TP QN, 24-HR UR
Alpha 1, Urine: DETECTED — AB
Alpha 2, Urine: DETECTED — AB
Beta, Urine: DETECTED — AB
Free Kappa/Lambda Ratio: 36.67 ratio — ABNORMAL HIGH (ref 2.04–10.37)
Total Protein, Urine-Ur/day: 26 mg/d (ref 10–140)
Total Protein, Urine: 1.6 mg/dL

## 2011-12-06 ENCOUNTER — Ambulatory Visit (INDEPENDENT_AMBULATORY_CARE_PROVIDER_SITE_OTHER): Payer: Medicare Other | Admitting: Neurology

## 2011-12-06 ENCOUNTER — Encounter: Payer: Self-pay | Admitting: Neurology

## 2011-12-06 VITALS — BP 138/64 | HR 56 | Wt 140.0 lb

## 2011-12-06 DIAGNOSIS — R259 Unspecified abnormal involuntary movements: Secondary | ICD-10-CM

## 2011-12-06 DIAGNOSIS — R251 Tremor, unspecified: Secondary | ICD-10-CM

## 2011-12-06 NOTE — Patient Instructions (Signed)
Your EEG is scheduled at [facility] on Tuesday, December 11, 2011 at 11:00 am.  Please arrive at first floor admitting fifteen minutes prior to your appointment.    [phone].   Your next appointment with Dr. Modesto Charon is scheduled on 02/08/12 at 11:30 am.   [phone].

## 2011-12-06 NOTE — Progress Notes (Signed)
Dear Dr. Jonny Ruiz,  I saw  Jon Hicks back in [place] [facility] for his problems with paroxysms of left arm shaking, probable gait dysfunction secondary to peripheral neuropathy, neck and head pain.  As you may recall, he is a 78 y.o. year old male with a history of tobacco abuse, who I saw 07/10/2011. At that time I felt he had likely cervicogenic headache pain. I gave him Flexeril to see if this would help him. He also described to shaking spells of his left hand that lasted minutes. I was unsure whether this was an exacerbation of his underlying essential tremor. Finally, I did feel he had a mild peripheral neuropathy in that his gait dysfunction albeit mild as well was likely secondary to his neuropathy. I ordered peripheral neuropathy labs revealed an IgG kappa monoclonal protein. He saw hematology who felt that it was unlikely this was secondary to a serious blood dyscrasia. Given the antibody was IgG I felt it was unrelated to his neuropathy.  Since I last saw him he has noted no change in his gait. When asked about whether he used Flexeril for his headaches he did not realize it was for his headaches and neck pain rather had used for the one episode of arm shaking he has had. Again this episode lasted minutes and he describes it as suppressible. There is no change in consciousness with the episode.  He does describe some temporal head pain. There is no jaw claudication. He does get some pain in his jaw unrelated to chewing. Notably his ESR and CRP which I checked at his last visit are normal. He's had no shoulder or hip pain and no visual changes.   Medical history, social history, and family history were reviewed and have not changed since the last clinic visit.  Current Outpatient Prescriptions on File Prior to Visit  Medication Sig Dispense Refill  . albuterol (PROAIR HFA) 108 (90 BASE) MCG/ACT inhaler Inhale 2 puffs into the lungs every 4 (four) hours as needed.        .  Clindamycin Phosphate foam Apply topically 2 (two) times daily.        . clobetasol (TEMOVATE) 0.05 % cream Apply topically as directed.        . cyclobenzaprine (FLEXERIL) 5 MG tablet Take 1 tablet (5 mg total) by mouth every 8 (eight) hours as needed (neck and head pain).  30 tablet  1  . ibuprofen (ADVIL) 200 MG tablet Take 200 mg by mouth every 6 (six) hours as needed.        . loratadine (CLARITIN) 10 MG tablet Take 10 mg by mouth daily.        Marland Kitchen tiotropium (SPIRIVA HANDIHALER) 18 MCG inhalation capsule Place 1 capsule (18 mcg total) into inhaler and inhale daily.  30 capsule  11  . aspirin 81 MG tablet Take 81 mg by mouth daily.          No Known Allergies  ROS:  13 systems were reviewed and are notable for shortness of breath, arm and leg pain, difficulty with balance, headaches.  All other review of systems are unremarkable.  Exam: . Filed Vitals:   12/06/11 1129  BP: 138/64  Pulse: 56  Weight: 140 lb (63.504 kg)    In general, thin appearing man.  H&N:  temporal arteries may be mildly thickened, however good pulses bilaterally.  No TMJ crepitation.  Mental status:   The patient is oriented to person, place and time. Recent  and remote memory are intact. Attention span and concentration are normal. Language including repetition, naming, following commands are intact. Fund of knowledge of current and historical events, as well as vocabulary are normal.  Cranial Nerves: Pupils are equally round and reactive to light. Extraocular movements are intact without nystagmus. Facial sensation and muscles of mastication are intact. Muscles of facial expression are symmetricTongue protrusion, uvula, palate midline.  Shoulder shrug intact  Motor:  Normal bulk and tone, no drift and 5/5 muscle strength bilaterally.  Reflexes:  Quiet throughout.  Coordination:  Normal finger to nose  Sensation:  Slightly decreased vibration and temperature in his feet.  Normal position sense.  Gait:   Normal gait and station.  Romberg negative.  Impression/Recommendations:  1.  Arm shaking spells - exacerbation of underlying tremor vs. ?seizure - I am going to get an EEG.  These spells are very infrequent.  Unlikely to be arm shaking TIA given normal bilateral carotid dopplers.  No treatment for now. 2.  Head pain/neck pain - He has been instructed to try to use Flexeril for this.  I still am not particularly concerned about temporal arteritis, although if his temporal head pain gets worse then I may have a temporal artery MRI done or biopsy. 3.  Gait dysfunction - quite mild.  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  He will continue to follow with Dr. Jonny Ruiz or heme-onc for his IgG kappa monoclonal protein.    We will see the patient back in 2 months.  Lupita Raider Modesto Charon, MD [facility], [place]

## 2011-12-11 ENCOUNTER — Ambulatory Visit (HOSPITAL_COMMUNITY): Payer: Medicare Other

## 2011-12-12 ENCOUNTER — Ambulatory Visit (INDEPENDENT_AMBULATORY_CARE_PROVIDER_SITE_OTHER): Payer: Medicare Other | Admitting: Internal Medicine

## 2011-12-12 ENCOUNTER — Encounter: Payer: Self-pay | Admitting: Internal Medicine

## 2011-12-12 ENCOUNTER — Other Ambulatory Visit (INDEPENDENT_AMBULATORY_CARE_PROVIDER_SITE_OTHER): Payer: Medicare Other

## 2011-12-12 VITALS — BP 120/70 | HR 56 | Temp 97.5°F | Ht 71.0 in | Wt 138.4 lb

## 2011-12-12 DIAGNOSIS — R079 Chest pain, unspecified: Secondary | ICD-10-CM

## 2011-12-12 DIAGNOSIS — E559 Vitamin D deficiency, unspecified: Secondary | ICD-10-CM

## 2011-12-12 DIAGNOSIS — J449 Chronic obstructive pulmonary disease, unspecified: Secondary | ICD-10-CM

## 2011-12-12 DIAGNOSIS — R5383 Other fatigue: Secondary | ICD-10-CM

## 2011-12-12 DIAGNOSIS — R5381 Other malaise: Secondary | ICD-10-CM

## 2011-12-12 DIAGNOSIS — K219 Gastro-esophageal reflux disease without esophagitis: Secondary | ICD-10-CM

## 2011-12-12 LAB — LIPID PANEL
Cholesterol: 177 mg/dL (ref 0–200)
LDL Cholesterol: 94 mg/dL (ref 0–99)

## 2011-12-12 LAB — TSH: TSH: 0.65 u[IU]/mL (ref 0.35–5.50)

## 2011-12-12 NOTE — Patient Instructions (Signed)
Continue all other medications as before Please have the pharmacy call with any refills you may need. You are up to date with prevention for now Please stop smoking Please keep your appointments with your specialists as you have planned Please go to LAB in the Basement for the blood and/or urine tests to be done today You will be contacted by phone if any changes need to be made immediately.  Otherwise, you will receive a letter about your results with an explanation. Please return in 6 months, or sooner if needed

## 2011-12-12 NOTE — Assessment & Plan Note (Addendum)
ECG reviewed as per emr, atypical , fleeting,  to f/u any worsening symptoms or concerns

## 2011-12-13 ENCOUNTER — Encounter: Payer: Self-pay | Admitting: Internal Medicine

## 2011-12-13 LAB — VITAMIN D 25 HYDROXY (VIT D DEFICIENCY, FRACTURES): Vit D, 25-Hydroxy: 34 ng/mL (ref 30–89)

## 2011-12-14 ENCOUNTER — Ambulatory Visit (HOSPITAL_COMMUNITY)
Admission: RE | Admit: 2011-12-14 | Discharge: 2011-12-14 | Disposition: A | Discharge status: Home / Self Care | Payer: Medicare Other | Source: Ambulatory Visit | Attending: Neurology | Admitting: Neurology

## 2011-12-14 DIAGNOSIS — Z7901 Long term (current) use of anticoagulants: Secondary | ICD-10-CM | POA: Insufficient documentation

## 2011-12-14 DIAGNOSIS — Z1389 Encounter for screening for other disorder: Secondary | ICD-10-CM | POA: Insufficient documentation

## 2011-12-14 DIAGNOSIS — R259 Unspecified abnormal involuntary movements: Secondary | ICD-10-CM | POA: Insufficient documentation

## 2011-12-14 DIAGNOSIS — R251 Tremor, unspecified: Secondary | ICD-10-CM

## 2011-12-14 DIAGNOSIS — R569 Unspecified convulsions: Secondary | ICD-10-CM

## 2011-12-14 NOTE — Progress Notes (Signed)
Routine EEG w/ video completed. 

## 2011-12-16 ENCOUNTER — Encounter: Payer: Self-pay | Admitting: Internal Medicine

## 2011-12-16 DIAGNOSIS — R079 Chest pain, unspecified: Secondary | ICD-10-CM | POA: Insufficient documentation

## 2011-12-16 NOTE — Progress Notes (Signed)
Subjective:    Patient ID: Jon Hicks, male    DOB: 1927/12/21, 78 y.o.   MRN: [id number]  HPI  Here to f/u;  Pt denies chest pain, increased sob or doe, wheezing, orthopnea, PND, increased LE swelling, palpitations, dizziness or syncope, except has had several sharp fleeting jolts of pain intermittent in the past wk, none in the past 2 days.  Has had worsening reflux recently as well but Denies worsening dysphagia, abd pain, n/v, bowel change or blood.  Pt denies new neurological symptoms such as new headache, or facial or extremity weakness or numbness, seeing Dr Modesto Charon, for EEG soon, muscle relaxer may have helped somewhat.  Denies hyper or hypo thyroid symptoms such as voice, skin or hair change.  Trying to follow lower chol diet.  Has f/u appt with Dr Juliette Alcide in late May.  Has an occasional cigar, no ETOH .  Due for vit D f/u.    Does have sense of ongoing fatigue, but denies signficant hypersomnolence. Past Medical History  Diagnosis Date  . COPD (chronic obstructive pulmonary disease)   . Psoriasis   . DJD (degenerative joint disease)     (no psoriatic arthritis per pt.); hx of left knee cortisone at [facility]  . Prostatitis     hx  . GERD (gastroesophageal reflux disease)   . Allergic rhinitis   . ALLERGIC RHINITIS 11/22/2009  . COPD 11/22/2009  . GERD 11/22/2009  . PSORIASIS 11/22/2009  . Vitamin d deficiency 12/01/2010   Past Surgical History  Procedure Date  . Cholecystectomy   . Edg     Dr. Rhea Belton esophageal nodule-normal tissue, 2000  . Cardiolite     normal exercise cardiolite 1999  . Urtheral     s/p urtheral stricture dilation-per urology-Dr. Alto Denver 1984  . Herniorrhapy     inguinal herniorrhapy-bilat    reports that he has been smoking Cigars.  He has never used smokeless tobacco. He reports that he does not drink alcohol or use illicit drugs. family history includes Arthritis in his mother and Heart disease in his father. No Known Allergies Current  Outpatient Prescriptions on File Prior to Visit  Medication Sig Dispense Refill  . albuterol (PROAIR HFA) 108 (90 BASE) MCG/ACT inhaler Inhale 2 puffs into the lungs every 4 (four) hours as needed.        Marland Kitchen aspirin 81 MG tablet Take 81 mg by mouth daily.        . Clindamycin Phosphate foam Apply topically 2 (two) times daily.        . clobetasol (TEMOVATE) 0.05 % cream Apply topically as directed.        . cyclobenzaprine (FLEXERIL) 5 MG tablet Take 1 tablet (5 mg total) by mouth every 8 (eight) hours as needed (neck and head pain).  30 tablet  1  . ibuprofen (ADVIL) 200 MG tablet Take 200 mg by mouth every 6 (six) hours as needed.        . loratadine (CLARITIN) 10 MG tablet Take 10 mg by mouth daily.        . Multiple Vitamin (MULTIVITAMIN) tablet Take 1 tablet by mouth daily.      Marland Kitchen tiotropium (SPIRIVA HANDIHALER) 18 MCG inhalation capsule Place 1 capsule (18 mcg total) into inhaler and inhale daily.  30 capsule  11   Review of Systems Review of Systems  Constitutional: Negative for diaphoresis and unexpected weight change.  Gastrointestinal: Negative for vomiting and blood in stool.  Genitourinary: Negative for hematuria and  decreased urine volume.  Musculoskeletal: Negative for gait problem.  Skin: Negative for color change and wound.  Neurological: Negative for tremors and numbness.  Psychiatric/Behavioral: Negative for decreased concentration. The patient is not hyperactive.       Objective:   Physical Exam BP 120/70  Pulse 56  Temp(Src) 97.5 F (36.4 C) (Oral)  Ht 5\' 11"  (1.803 m)  Wt 138 lb 6 oz (62.766 kg)  BMI 19.30 kg/m2  SpO2 95% Physical Exam  VS noted, not ill appearing Constitutional: Pt appears well-developed and well-nourished.  HENT: Head: Normocephalic.  Right Ear: External ear normal.  Left Ear: External ear normal.  Eyes: Conjunctivae and EOM are normal. Pupils are equal, round, and reactive to light.  Neck: Normal range of motion. Neck supple.    Cardiovascular: Normal rate and regular rhythm.   Pulmonary/Chest: Effort normal and breath sounds normal.  Abd:  Soft, NT, non-distended, + BS Neurological: Pt is alert. No cranial nerve deficit.  Skin: Skin is warm. No erythema.  Psychiatric: Pt behavior is normal. Thought content normal. 1+ nervous    Assessment & Plan:

## 2011-12-16 NOTE — Assessment & Plan Note (Signed)
Etiology unclear, Exam otherwise benign, to check labs as documented, follow with expectant management  

## 2011-12-16 NOTE — Assessment & Plan Note (Signed)
Pt prefers Pepcid AC for now, consider PPI if not controlled

## 2011-12-16 NOTE — Assessment & Plan Note (Signed)
stable overall by hx and exam, most recent data reviewed with pt, and pt to continue medical treatment as before  SpO2 Readings from Last 3 Encounters:  12/12/11 95%  06/27/11 99%  12/01/10 96%

## 2011-12-16 NOTE — Assessment & Plan Note (Signed)
Also to check vit d

## 2011-12-18 NOTE — Procedures (Signed)
EEG NUMBER:  [id number].  This routine EEG was requested in this 78 year old man who has a history of left arm shaking.  He is on no anticonvulsant medication.  EEG was done with the patient awake and drowsy.  During periods of maximal wakefulness, he had a low amplitude, moderately regulated and moderately sustained 10-11 cycle per second posterior dominant rhythm that attenuated with eye opening and was symmetric.  Background activities were composed of low to very low amplitude beta activities that had frontal predominance and were symmetric.  Hyperventilation for 3 minutes did not reveal any focal slowing, although did seem to increased the amplitude of underlying theta activities.  Photic stimulation produced symmetric driving response.  The patient did become drowsy as characterized by attenuation of muscle activity and the alpha rhythm.  Stage 2 sleep was not reached.  EKG revealed sinus bradycardia with the rate down to approximately 50 beats per minute.  CLINICAL INTERPRETATION:  This routine EEG done with the patient awake and drowsy is normal.          ______________________________ Denton Meek, MD    UX:LKGM D:  12/17/2011 22:38:23  T:  12/18/2011 07:13:13  Job #:  [id number]

## 2012-01-14 ENCOUNTER — Other Ambulatory Visit: Payer: Self-pay | Admitting: Dermatology

## 2012-01-24 ENCOUNTER — Other Ambulatory Visit: Payer: Self-pay | Admitting: Internal Medicine

## 2012-02-06 ENCOUNTER — Other Ambulatory Visit: Payer: Medicare Other | Admitting: Lab

## 2012-02-06 ENCOUNTER — Telehealth: Payer: Self-pay | Admitting: Oncology

## 2012-02-06 ENCOUNTER — Ambulatory Visit (HOSPITAL_BASED_OUTPATIENT_CLINIC_OR_DEPARTMENT_OTHER): Payer: Medicare Other | Admitting: Oncology

## 2012-02-06 VITALS — BP 119/61 | HR 59 | Temp 97.1°F | Ht 71.0 in | Wt 137.1 lb

## 2012-02-06 DIAGNOSIS — D729 Disorder of white blood cells, unspecified: Secondary | ICD-10-CM

## 2012-02-06 DIAGNOSIS — D7289 Other specified disorders of white blood cells: Secondary | ICD-10-CM

## 2012-02-06 LAB — CBC WITH DIFFERENTIAL/PLATELET
BASO%: 0.3 % (ref 0.0–2.0)
Basophils Absolute: 0 10*3/uL (ref 0.0–0.1)
EOS%: 0.8 % (ref 0.0–7.0)
MCH: 33.4 pg (ref 27.2–33.4)
MCHC: 33.2 g/dL (ref 32.0–36.0)
MCV: 100.7 fL — ABNORMAL HIGH (ref 79.3–98.0)
MONO%: 11.5 % (ref 0.0–14.0)
RDW: 13.9 % (ref 11.0–14.6)
lymph#: 1.7 10*3/uL (ref 0.9–3.3)

## 2012-02-06 NOTE — Telephone Encounter (Signed)
appts made and printed for pt aom °

## 2012-02-06 NOTE — Progress Notes (Signed)
Hematology and Oncology Follow Up Visit  Jon Hicks [id number] [birth year] 78 y.o. 02/06/2012 3:08 PM    Principle Diagnosis: 78 year old gentleman with the monoclonal protein in the form of IgG kappa by immunofixation. His M spike is about 1 g/dL, IgG level slightly elevated at 1860. 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   Current therapy: Observation and surveillance.   Interim History: [name] is a pleasant 78 year old gentleman I saw in 07/2012 for the evaluation for the monoclonal gammopathy. It appears like it is MGUS as there is no end organ damage and and negative skeletal survey. He did not have any opportunistic infection. Not had any hospitalization. Had not had any signs or symptoms of immune dysregulation. He is actually a fairly functional 78 year old, lives with his wife and attends to most activities of daily living. He is able to drive and perform most daily functions.    Medications: I have reviewed the patient's current medications. Current outpatient prescriptions:albuterol (PROAIR HFA) 108 (90 BASE) MCG/ACT inhaler, Inhale 2 puffs into the lungs every 4 (four) hours as needed.  , Disp: , Rfl: ;  aspirin 81 MG tablet, Take 81 mg by mouth daily.  , Disp: , Rfl: ;  Clindamycin Phosphate foam, Apply topically 2 (two) times daily.  , Disp: , Rfl: ;  clobetasol (TEMOVATE) 0.05 % cream, Apply topically as directed.  , Disp: , Rfl:  cyclobenzaprine (FLEXERIL) 5 MG tablet, Take 1 tablet (5 mg total) by mouth every 8 (eight) hours as needed (neck and head pain)., Disp: 30 tablet, Rfl: 1;  ibuprofen (ADVIL) 200 MG tablet, Take 200 mg by mouth every 6 (six) hours as needed.  , Disp: , Rfl: ;  loratadine (CLARITIN) 10 MG tablet, Take 10 mg by mouth daily.  , Disp: , Rfl: ;  Multiple Vitamin (MULTIVITAMIN) tablet, Take 1 tablet by mouth daily., Disp: , Rfl:  SPIRIVA HANDIHALER 18 MCG inhalation capsule, PLACE 1 CAPSULE (18 MCG TOTAL) INTO INHALER AND INHALE  DAILY., Disp: 30 each, Rfl: 11  Allergies: No Known Allergies  Past Medical History, Surgical history, Social history, and Family History were reviewed and updated.  Review of Systems: Constitutional:  Negative for fever, chills, night sweats, anorexia, weight loss, pain. Cardiovascular: no chest pain or dyspnea on exertion Respiratory: no cough, shortness of breath, or wheezing Neurological: negative Dermatological: negative ENT: negative Skin: Negative. Gastrointestinal: no abdominal pain, change in bowel habits, or black or bloody stools Genito-Urinary: no dysuria, trouble voiding, or hematuria Hematological and Lymphatic: negative Breast: negative Musculoskeletal: negative Remaining ROS negative. Physical Exam: Blood pressure 119/61, pulse 59, temperature 97.1 F (36.2 C), temperature source Oral, height 5\' 11"  (1.803 m), weight 137 lb 1.6 oz (62.188 kg). ECOG: 1 General appearance: alert Head: Normocephalic, without obvious abnormality, atraumatic Neck: no adenopathy, no carotid bruit, no JVD, supple, symmetrical, trachea midline and thyroid not enlarged, symmetric, no tenderness/mass/nodules Lymph nodes: Cervical, supraclavicular, and axillary nodes normal. Heart:regular rate and rhythm, S1, S2 normal, no murmur, click, rub or gallop Lung:chest clear, no wheezing, rales, normal symmetric air entry Abdomin: soft, non-tender, without masses or organomegaly EXT:no evidence of joint effusion   Lab Results: Lab Results  Component Value Date   WBC 4.7 02/06/2012   HGB 12.6* 02/06/2012   HCT 38.0* 02/06/2012   MCV 100.7* 02/06/2012   PLT 177 02/06/2012     Chemistry      Component Value Date/Time   NA 139 08/09/2011 1043   K  4.1 08/09/2011 1043   CL 103 08/09/2011 1043   CO2 31 08/09/2011 1043   BUN 11 08/09/2011 1043   CREATININE 0.73 08/09/2011 1043      Component Value Date/Time   CALCIUM 8.8 08/09/2011 1043   ALKPHOS 76 08/09/2011 1043   AST 20 08/09/2011 1043    ALT 12 08/09/2011 1043   BILITOT 0.4 08/09/2011 1043      Impression and Plan: This is a pleasant 78 year old gentleman with the  following issues.  1. 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 His M spike is about 1 g/dL, IgG level slightly elevated at 1860.  Differential diagnosis includes plasma cell disorder such as monoclonal gammopathy of undetermined significance which is I think the likely diagnosis, multiple myeloma or amyloidosis are less likely at this point.  The plan is to continue follow up with routine SPEP and protein studies on annual bases.  2. Follow up: in 12 months or sooner if needed.     [facility], MD 5/29/20133:08 PM

## 2012-02-07 LAB — COMPREHENSIVE METABOLIC PANEL
AST: 17 U/L (ref 0–37)
Albumin: 3.6 g/dL (ref 3.5–5.2)
Alkaline Phosphatase: 79 U/L (ref 39–117)
BUN: 10 mg/dL (ref 6–23)
Glucose, Bld: 112 mg/dL — ABNORMAL HIGH (ref 70–99)
Potassium: 4.6 mEq/L (ref 3.5–5.3)
Sodium: 140 mEq/L (ref 135–145)
Total Bilirubin: 0.3 mg/dL (ref 0.3–1.2)
Total Protein: 6.1 g/dL (ref 6.0–8.3)

## 2012-02-07 LAB — KAPPA/LAMBDA LIGHT CHAINS
Kappa free light chain: 0.98 mg/dL (ref 0.33–1.94)
Kappa:Lambda Ratio: 4.67 — ABNORMAL HIGH (ref 0.26–1.65)
Lambda Free Lght Chn: 0.21 mg/dL — ABNORMAL LOW (ref 0.57–2.63)

## 2012-02-08 ENCOUNTER — Encounter: Payer: Self-pay | Admitting: Neurology

## 2012-02-08 ENCOUNTER — Ambulatory Visit (INDEPENDENT_AMBULATORY_CARE_PROVIDER_SITE_OTHER): Payer: Medicare Other | Admitting: Neurology

## 2012-02-08 VITALS — BP 126/68 | HR 60 | Ht 68.75 in | Wt 136.0 lb

## 2012-02-08 DIAGNOSIS — R569 Unspecified convulsions: Secondary | ICD-10-CM

## 2012-02-08 NOTE — Progress Notes (Signed)
Dear Dr. Jonny Ruiz,   I saw Jon Hicks back in [facility] for his problems with paroxysms of left arm shaking, probable gait dysfunction secondary to peripheral neuropathy, neck and head pain. As you may recall, he is a 78 y.o. year old male with a history of tobacco abuse, who I saw 11/16/2011. At that time I felt he had likely cervicogenic headache pain. I gave him Flexeril to see if this would help him. He also described to shaking spells of his left hand that lasted minutes. I was unsure whether this was an exacerbation of his underlying essential tremor. Finally, I did feel he had a mild peripheral neuropathy in that his gait dysfunction albeit mild as well was likely secondary to his neuropathy. I ordered peripheral neuropathy labs revealed an IgG kappa monoclonal protein. He saw hematology who felt that it was unlikely this was secondary to a serious blood dyscrasia. Given the antibody was IgG I felt it was unrelated to his neuropathy.   He has not had any further shaking spells.  He does get a mild bilateral tremor at times, but nothing like his left arm shaking spell.  Notably an EEG I ordered last time was normal.  His head pain that he complained of has improved.  I felt this was a cervicogenic headache, and gave him flexeril for it.  However, he still uses the flexeril for the shaking and not the neck pain/head pain.  He still complains of neck pain but the shooting occipital head pains are better.  He does not feel his imbalance has changed.  Medical history, social history, and family history were reviewed and have not changed since the last clinic visit.  Current Outpatient Prescriptions on File Prior to Visit  Medication Sig Dispense Refill  . albuterol (PROAIR HFA) 108 (90 BASE) MCG/ACT inhaler Inhale 2 puffs into the lungs every 4 (four) hours as needed.        . Clindamycin Phosphate foam Apply topically 2 (two) times daily.        . clobetasol (TEMOVATE) 0.05 %  cream Apply topically as directed.        . cyclobenzaprine (FLEXERIL) 5 MG tablet Take 1 tablet (5 mg total) by mouth every 8 (eight) hours as needed (neck and head pain).  30 tablet  1  . ibuprofen (ADVIL) 200 MG tablet Take 200 mg by mouth every 6 (six) hours as needed.        . loratadine (CLARITIN) 10 MG tablet Take 10 mg by mouth daily.        . Multiple Vitamin (MULTIVITAMIN) tablet Take 1 tablet by mouth daily.      Marland Kitchen SPIRIVA HANDIHALER 18 MCG inhalation capsule PLACE 1 CAPSULE (18 MCG TOTAL) INTO INHALER AND INHALE DAILY.  30 each  11  . aspirin 81 MG tablet Take 81 mg by mouth daily.          No Known Allergies  ROS:  13 systems were reviewed and  are unremarkable.  Exam: . Filed Vitals:   02/08/12 1128  BP: 126/68  Pulse: 60  Height: 5' 8.75" (1.746 m)  Weight: 136 lb (61.689 kg)    In general, well appearing thin man, slightly unkempt.  Mental status:   The patient is oriented to person, place and time. Recent and remote memory are intact. Attention span and concentration are normal. Language including repetition, naming, following commands are intact. Fund of knowledge of current and historical events, as well as  vocabulary are normal.  Cranial Nerves: Pupils are equally round and reactive to light.  Extraocular movements are intact without nystagmus. Facial sensation and muscles of mastication are intact. Muscles of facial expression are symmetric. Tongue protrusion midline. Palate does not elevated as well on the right. Shoulder shrug intact  Motor:  Normal bulk and tone, no drift and 5/5 muscle strength bilaterally.  Reflexes:  Quiet throughout, toes down.  Coordination:  Normal finger to nose  Gait:  Normal gait and station.  Romberg negative.  Impression/Recommendations: 1.  Spells of left arm shaking - have not recurred, unknown etiology, but EEG, carotid dopplers normal making seizure and arm shaking TIA less likely.  Could be exacerbation of mild  tremor. 2.  Gait imbalance - likely due to PN associated with MGUS, unchanged. 3.  Neck pain/head pain improved - neck pain still there but likely MSK related.  He will continue to use Advil.  Lupita Raider Modesto Charon, MD [facility], [place]

## 2012-06-13 ENCOUNTER — Encounter: Payer: Self-pay | Admitting: Internal Medicine

## 2012-06-13 ENCOUNTER — Ambulatory Visit (INDEPENDENT_AMBULATORY_CARE_PROVIDER_SITE_OTHER)
Admission: RE | Admit: 2012-06-13 | Discharge: 2012-06-13 | Disposition: A | Discharge status: Home / Self Care | Payer: Medicare Other | Source: Ambulatory Visit | Attending: Internal Medicine | Admitting: Internal Medicine

## 2012-06-13 ENCOUNTER — Other Ambulatory Visit (INDEPENDENT_AMBULATORY_CARE_PROVIDER_SITE_OTHER): Payer: Medicare Other

## 2012-06-13 ENCOUNTER — Ambulatory Visit (INDEPENDENT_AMBULATORY_CARE_PROVIDER_SITE_OTHER): Payer: Medicare Other | Admitting: Internal Medicine

## 2012-06-13 VITALS — BP 120/70 | HR 59 | Temp 96.9°F | Ht 71.0 in | Wt 134.5 lb

## 2012-06-13 DIAGNOSIS — R634 Abnormal weight loss: Secondary | ICD-10-CM

## 2012-06-13 DIAGNOSIS — M542 Cervicalgia: Secondary | ICD-10-CM

## 2012-06-13 DIAGNOSIS — J449 Chronic obstructive pulmonary disease, unspecified: Secondary | ICD-10-CM

## 2012-06-13 LAB — BASIC METABOLIC PANEL
CO2: 33 mEq/L — ABNORMAL HIGH (ref 19–32)
Chloride: 102 mEq/L (ref 96–112)
Creatinine, Ser: 0.6 mg/dL (ref 0.4–1.5)
Potassium: 4.2 mEq/L (ref 3.5–5.1)
Sodium: 138 mEq/L (ref 135–145)

## 2012-06-13 LAB — URINALYSIS, ROUTINE W REFLEX MICROSCOPIC
Bilirubin Urine: NEGATIVE
Hgb urine dipstick: NEGATIVE
Nitrite: NEGATIVE
Total Protein, Urine: NEGATIVE
Urine Glucose: NEGATIVE
Urobilinogen, UA: 0.2 (ref 0.0–1.0)

## 2012-06-13 LAB — CBC WITH DIFFERENTIAL/PLATELET
Basophils Absolute: 0 10*3/uL (ref 0.0–0.1)
Eosinophils Absolute: 0 10*3/uL (ref 0.0–0.7)
Lymphocytes Relative: 32.5 % (ref 12.0–46.0)
MCHC: 32.7 g/dL (ref 30.0–36.0)
MCV: 101.6 fl — ABNORMAL HIGH (ref 78.0–100.0)
Monocytes Absolute: 0.8 10*3/uL (ref 0.1–1.0)
Neutrophils Relative %: 54.1 % (ref 43.0–77.0)
RDW: 14.2 % (ref 11.5–14.6)

## 2012-06-13 LAB — HEPATIC FUNCTION PANEL
ALT: 18 U/L (ref 0–53)
AST: 24 U/L (ref 0–37)
Alkaline Phosphatase: 95 U/L (ref 39–117)
Bilirubin, Direct: 0.1 mg/dL (ref 0.0–0.3)
Total Bilirubin: 0.5 mg/dL (ref 0.3–1.2)
Total Protein: 7 g/dL (ref 6.0–8.3)

## 2012-06-13 NOTE — Progress Notes (Signed)
Subjective:    Patient ID: JRU PENSE, male    DOB: 04/07/1928, 78 y.o.   MRN: [id number]  HPI  .Here to f/u; overall doing ok,  Pt denies chest pain, increased sob or doe, wheezing, orthopnea, PND, increased LE swelling, palpitations, dizziness or syncope.  Pt denies new neurological symptoms such as new headache, or facial or extremity weakness or numbness   Pt denies polydipsia, polyuria, or low sugar symptoms such as weakness or confusion improved with po intake.  Pt states overall good compliance with meds, trying to follow lower cholesterol diet.   Declines flu shot.  Has been seeing hematology,to f/u may 2014.  Concerned about wt loss 134 today, has  Been about 145 more in the past a few yrs ago, was 138 about 6 mo ago, actually doing less lately as far as activity, not going to the gym lately.  Feels worn out after a few hrs, has been doing more  Work around the house. Denies worsening depressive symptoms, suicidal ideation, or panic.  Does feel bad about having to put on th creams for psoriasis.  Denies hyper or hypo thyroid symptoms such as voice, skin or hair change.  Last TSH normal oct 2012.  Most recent cbc. cmet ok in may 2013 except glc slightly elev 112.   Pt denies fever, night sweats, loss of appetite, or other constitutional symptoms, states "I eat good."  Overall pain ok, though has some to the lower neck aching at times and pinpricks to heels that make his leg jump sometimes.  Still smokes 6-7 small cigars per day.   Last cxr mar 2011.  Does have sense of ongoing fatigue, but denies signficant hypersomnolence.  Feels offbalance and dizzy occasaionally.  Has some ffeling of bloating to abd, but seems better with gas pills and antiacids.    Past Medical History  Diagnosis Date  . COPD (chronic obstructive pulmonary disease)   . Psoriasis   . DJD (degenerative joint disease)     (no psoriatic arthritis per pt.); hx of left knee cortisone at [facility]  . Prostatitis     hx    . GERD (gastroesophageal reflux disease)   . Allergic rhinitis   . ALLERGIC RHINITIS 11/22/2009  . COPD 11/22/2009  . GERD 11/22/2009  . PSORIASIS 11/22/2009  . Vitamin d deficiency 12/01/2010   Past Surgical History  Procedure Date  . Cholecystectomy   . Edg     Dr. Rhea Belton esophageal nodule-normal tissue, 2000  . Cardiolite     normal exercise cardiolite 1999  . Urtheral     s/p urtheral stricture dilation-per urology-Dr. Alto Denver 1984  . Herniorrhapy     inguinal herniorrhapy-bilat    reports that he has been smoking Cigars.  He has never used smokeless tobacco. He reports that he does not drink alcohol or use illicit drugs. family history includes Arthritis in his mother and Heart disease in his father. No Known Allergies Current Outpatient Prescriptions on File Prior to Visit  Medication Sig Dispense Refill  . Clindamycin Phosphate foam Apply topically 2 (two) times daily.        . clobetasol (TEMOVATE) 0.05 % cream Apply topically as directed.        . cyclobenzaprine (FLEXERIL) 5 MG tablet Take 1 tablet (5 mg total) by mouth every 8 (eight) hours as needed (neck and head pain).  30 tablet  1  . ibuprofen (ADVIL) 200 MG tablet Take 200 mg by mouth every 6 (six) hours as  needed.        . loratadine (CLARITIN) 10 MG tablet Take 10 mg by mouth daily.        . Multiple Vitamin (MULTIVITAMIN) tablet Take 1 tablet by mouth daily.      Marland Kitchen SPIRIVA HANDIHALER 18 MCG inhalation capsule PLACE 1 CAPSULE (18 MCG TOTAL) INTO INHALER AND INHALE DAILY.  30 each  11  . albuterol (PROAIR HFA) 108 (90 BASE) MCG/ACT inhaler Inhale 2 puffs into the lungs every 4 (four) hours as needed.        Marland Kitchen aspirin 81 MG tablet Take 81 mg by mouth daily.         Review of Systems  Constitutional: Negative for diaphoresis and unexpected weight change.  HENT: Negative for tinnitus.  Does have some "cracking" to the right tmj, no pain Eyes: Negative for photophobia and visual disturbance.  Respiratory: Negative  for choking and stridor.   Gastrointestinal: Negative for vomiting and blood in stool.  Genitourinary: Negative for hematuria and decreased urine volume.  Musculoskeletal: Negative for gait problem.  Skin: Negative for color change and wound.  Neurological: Negative for numbness, has seen Dr Modesto Charon for shaking or tremor - no parkinsons  Psychiatric/Behavioral: Negative for decreased concentration. The patient is not hyperactive.       Objective:   Physical Exam BP 120/70  Pulse 59  Temp 96.9 F (36.1 C) (Oral)  Ht 5\' 11"  (1.803 m)  Wt 134 lb 8 oz (61.009 kg)  BMI 18.76 kg/m2  SpO2 96% Physical Exam  VS noted Constitutional: Pt appears well-developed and well-nourished.  HENT: Head: Normocephalic.  Right Ear: External ear normal.  Left Ear: External ear normal.  Eyes: Conjunctivae and EOM are normal. Pupils are equal, round, and reactive to light.  Neck: Normal range of motion. Neck supple.  Cardiovascular: Normal rate and regular rhythm.   Pulmonary/Chest: Effort normal and breath sounds mild decreased , no rales or wheezing Has spine kyphosis thoracic  Abd:  Soft, NT, non-distended, + BS Neurological: Pt is alert. Not confused  Skin: Skin is warm. No erythema. No acute joint swelling Psychiatric: Pt behavior is normal. Thought content normal.  - no dementia    Assessment & Plan:

## 2012-06-13 NOTE — Assessment & Plan Note (Signed)
stable overall by hx and exam, most recent data reviewed with pt, and pt to continue medical treatment as before SpO2 Readings from Last 3 Encounters:  06/13/12 96%  12/12/11 95%  06/27/11 99%

## 2012-06-13 NOTE — Assessment & Plan Note (Signed)
Suspect some mild geriatric decline, but still smoking  - urged to quit, also for cxr,labs

## 2012-06-13 NOTE — Patient Instructions (Signed)
Please stop smoking Continue all other medications as before Please go to XRAY in the Basement for the x-ray test Please go to LAB in the Basement for the blood and/or urine tests to be done today You will be contacted by phone if any changes need to be made immediately.  Otherwise, you will receive a letter about your results with an explanation. Please keep your appointments with your specialists as you have planned  - Dr Clelia Croft next May Please return in 6 months, or sooner if needed

## 2012-06-13 NOTE — Assessment & Plan Note (Signed)
stable overall by hx and exam, , and pt to continue medical treatment as before   

## 2012-07-14 ENCOUNTER — Other Ambulatory Visit: Payer: Self-pay | Admitting: Dermatology

## 2012-12-08 ENCOUNTER — Other Ambulatory Visit: Payer: Self-pay | Admitting: Dermatology

## 2012-12-15 ENCOUNTER — Ambulatory Visit (INDEPENDENT_AMBULATORY_CARE_PROVIDER_SITE_OTHER): Payer: Medicare Other | Admitting: Internal Medicine

## 2012-12-15 ENCOUNTER — Encounter: Payer: Self-pay | Admitting: Internal Medicine

## 2012-12-15 VITALS — BP 130/64 | HR 60 | Temp 97.0°F | Ht 71.0 in | Wt 137.5 lb

## 2012-12-15 DIAGNOSIS — K219 Gastro-esophageal reflux disease without esophagitis: Secondary | ICD-10-CM

## 2012-12-15 DIAGNOSIS — J309 Allergic rhinitis, unspecified: Secondary | ICD-10-CM

## 2012-12-15 DIAGNOSIS — J449 Chronic obstructive pulmonary disease, unspecified: Secondary | ICD-10-CM

## 2012-12-15 NOTE — Assessment & Plan Note (Signed)
stable overall by history and exam, recent data reviewed with pt, and pt to continue medical treatment as before,  to f/u any worsening symptoms or concerns SpO2 Readings from Last 3 Encounters:  12/15/12 99%  06/13/12 96%  12/12/11 95%

## 2012-12-15 NOTE — Assessment & Plan Note (Signed)
stable overall by history and exam, and pt to continue medical treatment as before,  to f/u any worsening symptoms or concern 

## 2012-12-15 NOTE — Assessment & Plan Note (Signed)
stable overall by history and exam, recent data reviewed with pt, and pt to continue medical treatment as before,  to f/u any worsening symptoms or concerns Lab Results  Component Value Date   WBC 6.0 06/13/2012   HGB 12.8* 06/13/2012   HCT 39.3 06/13/2012   PLT 185.0 06/13/2012   GLUCOSE 81 06/13/2012   CHOL 177 12/12/2011   TRIG 71.0 12/12/2011   HDL 69.00 12/12/2011   LDLCALC 94 12/12/2011   ALT 18 06/13/2012   AST 24 06/13/2012   NA 138 06/13/2012   K 4.2 06/13/2012   CL 102 06/13/2012   CREATININE 0.6 06/13/2012   BUN 10 06/13/2012   CO2 33* 06/13/2012   TSH 1.04 06/13/2012   PSA 1.53 11/22/2009   HGBA1C 5.8 07/10/2011

## 2012-12-15 NOTE — Patient Instructions (Addendum)
.  Please continue all other medications as before, and refills have been done if requested. Please have the pharmacy call with any other refills you may need. Please continue your efforts at being more active, low cholesterol diet, and weight control. Please keep your appointments with your specialists as you have planned Please stop smoking Please remember to sign up for My Chart if you have not done so, as this will be important to you in the future with finding out test results, communicating by private email, and scheduling acute appointments online when needed. Please return in 6 months, or sooner if needed

## 2012-12-15 NOTE — Progress Notes (Signed)
Subjective:    Patient ID: Jon Hicks, male    DOB: 11-23-1927, 79 y.o.   MRN: [id number]  HPI Here to f/u; overall doing ok,  Pt denies chest pain, increased sob or doe, wheezing, orthopnea, PND, increased LE swelling, palpitations, dizziness or syncope.  Pt denies polydipsia, polyuria, or low sugar symptoms such as weakness or confusion improved with po intake.  Pt denies new neurological symptoms such as new headache, or facial or extremity weakness or numbness.   Pt states overall good compliance with meds, has been trying to follow lower cholesterol diet, with wt overall stable,  but little exercise however. Only uses spiriva occasionally   Saw dermatology with facial lesion removed, non malignant.  Still smoking several cigarretes per day, not interested in quitting. Rare ETOH use only.  Has persistent elev MCV and ? Slight anemia hgb 12.7  X 6 mo ago, b12 normal oct 2012. Has vasc test f/u for apr 2014, and plans to f/u with Dr Roseanne Kaufman as well , may 21.   Denies worsening reflux, abd pain, dysphagia, n/v, bowel change or blood. Does have several wks ongoing nasal allergy symptoms with clearish congestion, itch and sneezing, without fever, pain, and better when re-started his claritin Past Medical History  Diagnosis Date  . COPD (chronic obstructive pulmonary disease)   . Psoriasis   . DJD (degenerative joint disease)     (no psoriatic arthritis per pt.); hx of left knee cort[facility]one at [facility]  . Prostatitis     hx  . GERD (gastroesophageal reflux disease)   . Allergic rhinitis   . ALLERGIC RHINITIS 11/22/2009  . COPD 11/22/2009  . GERD 11/22/2009  . PSORIASIS 11/22/2009  . Vitamin D deficiency 12/01/2010   Past Surgical History  Procedure Laterality Date  . Cholecystectomy    . Edg      Dr. Rhea Belton esophageal nodule-normal tissue, 2000  . Cardiolite      normal exercise cardiolite 1999  . Urtheral      s/p urtheral stricture dilation-per urology-Dr. Alto [place] 1984  .  Herniorrhapy      inguinal herniorrhapy-bilat    reports that he has been smoking Cigars.  He has never used smokeless tobacco. He reports that he does not drink alcohol or use illicit drugs. family history includes Arthritis in his mother and Heart disease in his father. No Known Allergies Current Outpatient Prescriptions on File Prior to Visit  Medication Sig Dispense Refill  . albuterol (PROAIR HFA) 108 (90 BASE) MCG/ACT inhaler Inhale 2 puffs into the lungs every 4 (four) hours as needed.        Marland Kitchen aspirin 81 MG tablet Take 81 mg by mouth daily.        . Clindamycin Phosphate foam Apply topically 2 (two) times daily.        . clobetasol (TEMOVATE) 0.05 % cream Apply topically as directed.        . desonide (DESOWEN) 0.05 % cream Apply topically 2 (two) times daily.      Marland Kitchen ibuprofen (ADVIL) 200 MG tablet Take 200 mg by mouth every 6 (six) hours as needed.        . loratadine (CLARITIN) 10 MG tablet Take 10 mg by mouth daily.        . Multiple Vitamin (MULTIVITAMIN) tablet Take 1 tablet by mouth daily.      Marland Kitchen SPIRIVA HANDIHALER 18 MCG inhalation capsule PLACE 1 CAPSULE (18 MCG TOTAL) INTO INHALER AND INHALE DAILY.  30 each  11   No current facility-administered medications on file prior to visit.     Review of Systems  Constitutional: Negative for unexpected weight change, or unusual diaphoresis  HENT: Negative for tinnitus.   Eyes: Negative for photophobia and visual disturbance.  Respiratory: Negative for choking and stridor.   Gastrointestinal: Negative for vomiting and blood in stool.  Genitourinary: Negative for hematuria and decreased urine volume.  Musculoskeletal: Negative for acute joint swelling Skin: Negative for color change and wound.  Neurological: Negative for tremors and numbness other than noted  Psychiatric/Behavioral: Negative for decreased concentration or  hyperactivity.       Objective:   Physical Exam BP 130/64  Pulse 60  Temp(Src) 97 F (36.1 C) (Oral)   Ht 5\' 11"  (1.803 m)  Wt 137 lb 8 oz (62.37 kg)  BMI 19.19 kg/m2  SpO2 99% VS noted,  Constitutional: Pt appears well-developed and well-nourished.  HENT: Head: NCAT.  Right Ear: External ear normal.  Left Ear: External ear normal.  Eyes: Conjunctivae and EOM are normal. Pupils are equal, round, and reactive to light.  Neck: Normal range of motion. Neck supple.  Cardiovascular: Normal rate and regular rhythm.   Pulmonary/Chest: Effort normal and breath sounds without rales or wheezing.  Abd:  Soft, NT, non-distended, + BS Neurological: Pt is alert. Not confused  Skin: Skin is warm. No erythema.  Psychiatric: Pt behavior is normal. Thought content normal.     Assessment & Plan:

## 2012-12-31 ENCOUNTER — Encounter (INDEPENDENT_AMBULATORY_CARE_PROVIDER_SITE_OTHER): Payer: Medicare Other

## 2012-12-31 DIAGNOSIS — I6529 Occlusion and stenosis of unspecified carotid artery: Secondary | ICD-10-CM

## 2013-01-01 ENCOUNTER — Encounter: Payer: Self-pay | Admitting: Internal Medicine

## 2013-01-28 ENCOUNTER — Ambulatory Visit (HOSPITAL_BASED_OUTPATIENT_CLINIC_OR_DEPARTMENT_OTHER): Payer: Medicare Other | Admitting: Oncology

## 2013-01-28 ENCOUNTER — Telehealth: Payer: Self-pay | Admitting: Oncology

## 2013-01-28 ENCOUNTER — Other Ambulatory Visit (HOSPITAL_BASED_OUTPATIENT_CLINIC_OR_DEPARTMENT_OTHER): Payer: Medicare Other | Admitting: Lab

## 2013-01-28 VITALS — BP 144/51 | HR 60 | Temp 97.9°F | Resp 17 | Ht 71.0 in | Wt 135.0 lb

## 2013-01-28 DIAGNOSIS — D472 Monoclonal gammopathy: Secondary | ICD-10-CM

## 2013-01-28 DIAGNOSIS — D729 Disorder of white blood cells, unspecified: Secondary | ICD-10-CM

## 2013-01-28 LAB — CBC WITH DIFFERENTIAL/PLATELET
BASO%: 0.2 % (ref 0.0–2.0)
Eosinophils Absolute: 0 10*3/uL (ref 0.0–0.5)
MCV: 99.5 fL — ABNORMAL HIGH (ref 79.3–98.0)
MONO#: 0.7 10*3/uL (ref 0.1–0.9)
MONO%: 10.3 % (ref 0.0–14.0)
NEUT#: 3.8 10*3/uL (ref 1.5–6.5)
RBC: 3.8 10*6/uL — ABNORMAL LOW (ref 4.20–5.82)
RDW: 14.2 % (ref 11.0–14.6)
WBC: 6.4 10*3/uL (ref 4.0–10.3)

## 2013-01-28 LAB — COMPREHENSIVE METABOLIC PANEL (CC13)
AST: 18 U/L (ref 5–34)
BUN: 13.6 mg/dL (ref 7.0–26.0)
CO2: 29 mEq/L (ref 22–29)
Calcium: 8.7 mg/dL (ref 8.4–10.4)
Chloride: 103 mEq/L (ref 98–107)
Creatinine: 0.8 mg/dL (ref 0.7–1.3)
Glucose: 126 mg/dl — ABNORMAL HIGH (ref 70–99)

## 2013-01-28 NOTE — Telephone Encounter (Signed)
gv and printed appt sched and avs for pt. for 2015 °

## 2013-01-28 NOTE — Progress Notes (Signed)
Hematology and Oncology Follow Up Visit  Jon Hicks [id number] 08/19/1928 79 y.o. 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 His M spike is about 1 g/dL, IgG level slightly elevated at 1860. 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   Current therapy: Observation and surveillance.   Interim History: Jon Hicks is a pleasant 79 year old gentleman I see for the  evaluation for the monoclonal gammopathy. It appears like it is MGUS as there is no end organ damage and and negative skeletal survey. He did not have any opportunistic infection. Not had any hospitalization. Had not had any signs or symptoms of immune dysregulation. He is actually a fairly functional 79 year old, lives with his wife and attends to most activities of daily living. He is able to drive and perform most daily functions. No recent illnesses or hospitalizations.   Medications: I have reviewed the patient's current medications.  Current Outpatient Prescriptions  Medication Sig Dispense Refill  . Clindamycin Phosphate foam Apply topically 2 (two) times daily.        . clobetasol (TEMOVATE) 0.05 % cream Apply topically as directed.        Marland Kitchen CLOBETASOL PROPIONATE E 0.05 % emollient cream Apply 1 application topically daily.      Marland Kitchen desonide (DESOWEN) 0.05 % cream Apply topically 2 (two) times daily.      Marland Kitchen ibuprofen (ADVIL) 200 MG tablet Take 200 mg by mouth every 6 (six) hours as needed.        . loratadine (CLARITIN) 10 MG tablet Take 10 mg by mouth daily.        . Multiple Vitamin (MULTIVITAMIN) tablet Take 1 tablet by mouth daily.      Marland Kitchen SPIRIVA HANDIHALER 18 MCG inhalation capsule PLACE 1 CAPSULE (18 MCG TOTAL) INTO INHALER AND INHALE DAILY.  30 each  11   No current facility-administered medications for this visit.    Allergies: No Known Allergies  Past Medical History, Surgical history, Social  history, and Family History were reviewed and updated.  Review of Systems: Constitutional:  Negative for fever, chills, night sweats, anorexia, weight loss, pain. Cardiovascular: no chest pain or dyspnea on exertion Respiratory: no cough, shortness of breath, or wheezing Neurological: negative Dermatological: negative ENT: negative Skin: Negative. Gastrointestinal: no abdominal pain, change in bowel habits, or black or bloody stools Genito-Urinary: no dysuria, trouble voiding, or hematuria Hematological and Lymphatic: negative Breast: negative Musculoskeletal: negative Remaining ROS negative. Physical Exam: Blood pressure 144/51, pulse 60, temperature 97.9 F (36.6 C), temperature source Oral, resp. rate 17, height 5\' 11"  (1.803 m), weight 135 lb (61.236 kg). ECOG: 1 General appearance: alert Head: Normocephalic, without obvious abnormality, atraumatic Neck: no adenopathy, no carotid bruit, no JVD, supple, symmetrical, trachea midline and thyroid not enlarged, symmetric, no tenderness/mass/nodules Lymph nodes: Cervical, supraclavicular, and axillary nodes normal. Heart:regular rate and rhythm, S1, S2 normal, no murmur, click, rub or gallop Lung:chest clear, no wheezing, rales, normal symmetric air entry Abdomin: soft, non-tender, without masses or organomegaly EXT:no evidence of joint effusion   Lab Results: Lab Results  Component Value Date   WBC 6.4 01/28/2013   HGB 12.6* 01/28/2013   HCT 37.8* 01/28/2013   MCV 99.5* 01/28/2013   PLT 168 01/28/2013     Chemistry      Component Value Date/Time   NA 138 06/13/2012 1146   K 4.2 06/13/2012 1146   CL 102  06/13/2012 1146   CO2 33* 06/13/2012 1146   BUN 10 06/13/2012 1146   CREATININE 0.6 06/13/2012 1146      Component Value Date/Time   CALCIUM 9.1 06/13/2012 1146   ALKPHOS 95 06/13/2012 1146   AST 24 06/13/2012 1146   ALT 18 06/13/2012 1146   BILITOT 0.5 06/13/2012 1146      Impression and Plan: This is a pleasant 79 year old  gentleman with the  following issues.  1. 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 His M spike is about 1 g/dL, IgG level slightly elevated at 1860.  Differential diagnosis includes plasma cell disorder such as monoclonal gammopathy of undetermined significance which is I think the likely diagnosis, multiple myeloma or amyloidosis are less likely at this point.  The plan is to continue follow up with routine SPEP and protein studies on annual bases.  2. Follow up: in 12 months or sooner if needed.     [facility], MD 5/21/20143:09 PM

## 2013-01-30 LAB — SPEP & IFE WITH QIG
Beta Globulin: 6.2 % (ref 4.7–7.2)
Gamma Globulin: 22.8 % — ABNORMAL HIGH (ref 11.1–18.8)
IgA: 144 mg/dL (ref 68–379)
IgG (Immunoglobin G), Serum: 1490 mg/dL (ref 650–1600)
M-Spike, %: 1.14 g/dL
Total Protein, Serum Electrophoresis: 6.6 g/dL (ref 6.0–8.3)

## 2013-01-30 LAB — KAPPA/LAMBDA LIGHT CHAINS: Lambda Free Lght Chn: 1.27 mg/dL (ref 0.57–2.63)

## 2013-06-17 ENCOUNTER — Other Ambulatory Visit (INDEPENDENT_AMBULATORY_CARE_PROVIDER_SITE_OTHER): Payer: Medicare Other

## 2013-06-17 ENCOUNTER — Ambulatory Visit (INDEPENDENT_AMBULATORY_CARE_PROVIDER_SITE_OTHER): Payer: Medicare Other | Admitting: Internal Medicine

## 2013-06-17 ENCOUNTER — Encounter: Payer: Self-pay | Admitting: Internal Medicine

## 2013-06-17 VITALS — BP 114/64 | HR 72 | Temp 97.7°F | Ht 71.0 in | Wt 133.5 lb

## 2013-06-17 DIAGNOSIS — K219 Gastro-esophageal reflux disease without esophagitis: Secondary | ICD-10-CM

## 2013-06-17 DIAGNOSIS — J309 Allergic rhinitis, unspecified: Secondary | ICD-10-CM

## 2013-06-17 DIAGNOSIS — J449 Chronic obstructive pulmonary disease, unspecified: Secondary | ICD-10-CM

## 2013-06-17 DIAGNOSIS — R5381 Other malaise: Secondary | ICD-10-CM

## 2013-06-17 DIAGNOSIS — E785 Hyperlipidemia, unspecified: Secondary | ICD-10-CM

## 2013-06-17 DIAGNOSIS — Z23 Encounter for immunization: Secondary | ICD-10-CM

## 2013-06-17 LAB — CBC WITH DIFFERENTIAL/PLATELET
Basophils Absolute: 0 10*3/uL (ref 0.0–0.1)
Eosinophils Absolute: 0 10*3/uL (ref 0.0–0.7)
Lymphocytes Relative: 38 % (ref 12.0–46.0)
MCHC: 33.8 g/dL (ref 30.0–36.0)
MCV: 99.2 fl (ref 78.0–100.0)
Monocytes Absolute: 0.7 10*3/uL (ref 0.1–1.0)
Monocytes Relative: 12 % (ref 3.0–12.0)
Neutro Abs: 2.8 10*3/uL (ref 1.4–7.7)
Neutrophils Relative %: 49.3 % (ref 43.0–77.0)
Platelets: 160 10*3/uL (ref 150.0–400.0)
RDW: 14.9 % — ABNORMAL HIGH (ref 11.5–14.6)
WBC: 5.6 10*3/uL (ref 4.5–10.5)

## 2013-06-17 LAB — HEPATIC FUNCTION PANEL
ALT: 19 U/L (ref 0–53)
AST: 23 U/L (ref 0–37)
Bilirubin, Direct: 0.1 mg/dL (ref 0.0–0.3)
Total Bilirubin: 0.5 mg/dL (ref 0.3–1.2)
Total Protein: 7.1 g/dL (ref 6.0–8.3)

## 2013-06-17 LAB — BASIC METABOLIC PANEL
BUN: 10 mg/dL (ref 6–23)
CO2: 31 mEq/L (ref 19–32)
Calcium: 9.3 mg/dL (ref 8.4–10.5)
Chloride: 105 mEq/L (ref 96–112)
Creatinine, Ser: 0.7 mg/dL (ref 0.4–1.5)

## 2013-06-17 LAB — LIPID PANEL
Cholesterol: 150 mg/dL (ref 0–200)
HDL: 59.7 mg/dL (ref >39.00)
LDL Cholesterol: 71 mg/dL (ref 0–99)
Triglycerides: 98 mg/dL (ref 0.0–149.0)

## 2013-06-17 LAB — TSH: TSH: 0.86 u[IU]/mL (ref 0.35–5.50)

## 2013-06-17 NOTE — Progress Notes (Signed)
Subjective:    Patient ID: Jon Hicks, male    DOB: 1927/10/25, 79 y.o.   MRN: [id number]  HPI  Here for yearly f/u;  Overall doing ok;  Pt denies CP, worsening SOB, DOE, wheezing, orthopnea, PND, worsening LE edema, palpitations, dizziness or syncope.  Pt denies neurological change such as new headache, facial or extremity weakness.  Pt denies polydipsia, polyuria, or low sugar symptoms. Pt states overall good compliance with treatment and medications, good tolerability, and has been trying to follow lower cholesterol diet.  Pt denies worsening depressive symptoms, suicidal ideation or panic. No fever, night sweats, wt loss, loss of appetite, or other constitutional symptoms.  Pt states good ability with ADL's, has low fall risk, home safety reviewed and adequate, no other significant changes in hearing or vision, and only occasionally active with exercise.  Has seen hematology may 2014, with planned f/u for may 2015.  Due for flu shot, ok for prevnar.  Allergy and reflux symptoms controlled.  Trying to continue low chol diet. Does c/o ongoing fatigue, but denies signficant daytime hypersomnolence.  Past Medical History  Diagnosis Date  . COPD (chronic obstructive pulmonary disease)   . Psoriasis   . DJD (degenerative joint disease)     (no psoriatic arthritis per pt.); hx of left knee cortisone at [facility] [place]  . Prostatitis     hx  . GERD (gastroesophageal reflux disease)   . Allergic rhinitis   . ALLERGIC RHINITIS 11/22/2009  . COPD 11/22/2009  . GERD 11/22/2009  . PSORIASIS 11/22/2009  . Vitamin D deficiency 12/01/2010   Past Surgical History  Procedure Laterality Date  . Cholecystectomy    . Edg      Dr. Rhea Belton esophageal nodule-normal tissue, 2000  . Cardiolite      normal exercise cardiolite 1999  . Urtheral      s/p urtheral stricture dilation-per urology-Dr. Alto [place] 1984  . Herniorrhapy      inguinal herniorrhapy-bilat    reports that he has been smoking Cigars.  He has  never used smokeless tobacco. He reports that he does not drink alcohol or use illicit drugs. family history includes Arthritis in his mother; Heart disease in his father. No Known Allergies Current Outpatient Prescriptions on File Prior to Visit  Medication Sig Dispense Refill  . Clindamycin Phosphate foam Apply topically 2 (two) times daily.        . clobetasol (TEMOVATE) 0.05 % cream Apply topically as directed.        Marland Kitchen CLOBETASOL PROPIONATE E 0.05 % emollient cream Apply 1 application topically daily.      Marland Kitchen desonide (DESOWEN) 0.05 % cream Apply topically 2 (two) times daily.      Marland Kitchen ibuprofen (ADVIL) 200 MG tablet Take 200 mg by mouth every 6 (six) hours as needed.        . loratadine (CLARITIN) 10 MG tablet Take 10 mg by mouth daily.        . Multiple Vitamin (MULTIVITAMIN) tablet Take 1 tablet by mouth daily.      Marland Kitchen SPIRIVA HANDIHALER 18 MCG inhalation capsule PLACE 1 CAPSULE (18 MCG TOTAL) INTO INHALER AND INHALE DAILY.  30 each  11   No current facility-administered medications on file prior to visit.   Review of Systems Constitutional: Negative for diaphoresis, activity change, appetite change or unexpected weight change.  HENT: Negative for hearing loss, ear pain, facial swelling, mouth sores and neck stiffness.   Eyes: Negative for pain, redness and visual disturbance.  Respiratory: Negative for shortness of breath and wheezing.   Cardiovascular: Negative for chest pain and palpitations.  Gastrointestinal: Negative for diarrhea, blood in stool, abdominal distention or other pain Genitourinary: Negative for hematuria, flank pain or change in urine volume.  Musculoskeletal: Negative for myalgias and joint swelling.  Skin: Negative for color change and wound.  Neurological: Negative for syncope and numbness. other than noted Hematological: Negative for adenopathy.  Psychiatric/Behavioral: Negative for hallucinations, self-injury, decreased concentration and agitation.       Objective:   Physical Exam BP 114/64  Pulse 72  Temp(Src) 97.7 F (36.5 C) (Oral)  Ht 5\' 11"  (1.803 m)  Wt 133 lb 8 oz (60.555 kg)  BMI 18.63 kg/m2  SpO2 99% VS noted, Constitutional: Pt is oriented to person, place, and time./thin Appears well-developed and well-nourished.  Head: Normocephalic and atraumatic.  Right Ear: External ear normal.  Left Ear: External ear normal.  Nose: Nose normal.  Mouth/Throat: Oropharynx is clear and moist.  Eyes: Conjunctivae and EOM are normal. Pupils are equal, round, and reactive to light.  Neck: Normal range of motion. Neck supple. No JVD present. No tracheal deviation present.  Cardiovascular: Normal rate, regular rhythm, normal heart sounds and intact distal pulses.   Pulmonary/Chest: Effort normal and breath sounds normal.  Abdominal: Soft. Bowel sounds are normal. There is no tenderness. No HSM  Musculoskeletal: Normal range of motion. Exhibits no edema.  Lymphadenopathy:  Has no cervical adenopathy.  Neurological: Pt is alert and oriented to person, place, and time. Pt has normal reflexes. No cranial nerve deficit.  Skin: Skin is warm and dry. No rash noted.  Psychiatric:  Has  normal mood and affect. Behavior is normal.     Assessment & Plan:

## 2013-06-17 NOTE — Addendum Note (Signed)
Addended by: Scharlene Gloss B on: 06/17/2013 11:36 AM   Modules accepted: Orders

## 2013-06-17 NOTE — Assessment & Plan Note (Signed)
stable overall by history and exam, and pt to continue medical treatment as before,  to f/u any worsening symptoms or concerns 

## 2013-06-17 NOTE — Assessment & Plan Note (Deleted)

## 2013-06-17 NOTE — Assessment & Plan Note (Signed)
Etiology unclear, Exam otherwise benign, to check labs as documented, follow with expectant management  

## 2013-06-17 NOTE — Patient Instructions (Signed)
You had the flu shot today, and the Prevnar Pneumonia shot today Please continue all other medications as before Please have the pharmacy call with any other refills you may need. Please continue your efforts at being more active, low cholesterol diet, and weight control. You are otherwise up to date with prevention measures today. Please keep your appointments with your specialists as you may have planned Please go to the LAB in the Basement (turn left off the elevator) for the tests to be done today You will be contacted by phone if any changes need to be made immediately.  Otherwise, you will receive a letter about your results with an explanation, but please check with MyChart first.  Please remember to sign up for My Chart if you have not done so, as this will be important to you in the future with finding out test results, communicating by private email, and scheduling acute appointments online when needed.  Please return in 6 months, or sooner if needed

## 2013-06-17 NOTE — Assessment & Plan Note (Signed)
stable overall by history and exam, recent data reviewed with pt, and pt to continue medical treatment as before,  to f/u any worsening symptoms or concerns Lab Results  Component Value Date   WBC 6.4 01/28/2013   HGB 12.6* 01/28/2013   HCT 37.8* 01/28/2013   PLT 168 01/28/2013   GLUCOSE 126* 01/28/2013   CHOL 177 12/12/2011   TRIG 71.0 12/12/2011   HDL 69.00 12/12/2011   LDLCALC 94 12/12/2011   ALT 14 01/28/2013   AST 18 01/28/2013   NA 139 01/28/2013   K 4.3 01/28/2013   CL 103 01/28/2013   CREATININE 0.8 01/28/2013   BUN 13.6 01/28/2013   CO2 29 01/28/2013   TSH 1.04 06/13/2012   PSA 1.53 11/22/2009   HGBA1C 5.8 07/10/2011

## 2013-06-17 NOTE — Assessment & Plan Note (Signed)
stable overall by history and exam, recent data reviewed with pt, and pt to continue medical treatment as before,  to f/u any worsening symptoms or concerns SpO2 Readings from Last 3 Encounters:  06/17/13 99%  12/15/12 99%  06/13/12 96%    Note:  Total time for pt hx, exam, review of record with pt in the room, determination of diagnoses and plan for further eval and tx is > 40 min, with over 50% spent in coordination and counseling of patient

## 2013-06-18 ENCOUNTER — Encounter: Payer: Self-pay | Admitting: Internal Medicine

## 2013-07-27 ENCOUNTER — Inpatient Hospital Stay (HOSPITAL_COMMUNITY): Payer: Medicare Other

## 2013-07-27 ENCOUNTER — Inpatient Hospital Stay (HOSPITAL_COMMUNITY)
Admission: EM | Admit: 2013-07-27 | Discharge: 2013-07-29 | DRG: 247 | Disposition: A | Discharge status: Home / Self Care | Payer: Medicare Other | Attending: Cardiovascular Disease | Admitting: Cardiovascular Disease

## 2013-07-27 ENCOUNTER — Encounter (HOSPITAL_COMMUNITY)
Admission: EM | Disposition: A | Discharge status: Home / Self Care | Payer: Self-pay | Source: Home / Self Care | Attending: Cardiovascular Disease

## 2013-07-27 ENCOUNTER — Encounter (HOSPITAL_COMMUNITY): Payer: Self-pay | Admitting: Emergency Medicine

## 2013-07-27 DIAGNOSIS — I2510 Atherosclerotic heart disease of native coronary artery without angina pectoris: Secondary | ICD-10-CM | POA: Diagnosis present

## 2013-07-27 DIAGNOSIS — J449 Chronic obstructive pulmonary disease, unspecified: Secondary | ICD-10-CM | POA: Diagnosis present

## 2013-07-27 DIAGNOSIS — I2582 Chronic total occlusion of coronary artery: Secondary | ICD-10-CM | POA: Diagnosis present

## 2013-07-27 DIAGNOSIS — M1990 Unspecified osteoarthritis, unspecified site: Secondary | ICD-10-CM | POA: Diagnosis present

## 2013-07-27 DIAGNOSIS — I519 Heart disease, unspecified: Secondary | ICD-10-CM | POA: Diagnosis present

## 2013-07-27 DIAGNOSIS — Z79899 Other long term (current) drug therapy: Secondary | ICD-10-CM

## 2013-07-27 DIAGNOSIS — I472 Ventricular tachycardia, unspecified: Secondary | ICD-10-CM | POA: Diagnosis not present

## 2013-07-27 DIAGNOSIS — E559 Vitamin D deficiency, unspecified: Secondary | ICD-10-CM | POA: Diagnosis present

## 2013-07-27 DIAGNOSIS — I498 Other specified cardiac arrhythmias: Secondary | ICD-10-CM | POA: Diagnosis present

## 2013-07-27 DIAGNOSIS — F17200 Nicotine dependence, unspecified, uncomplicated: Secondary | ICD-10-CM | POA: Diagnosis present

## 2013-07-27 DIAGNOSIS — K219 Gastro-esophageal reflux disease without esophagitis: Secondary | ICD-10-CM | POA: Diagnosis present

## 2013-07-27 DIAGNOSIS — I4729 Other ventricular tachycardia: Secondary | ICD-10-CM | POA: Diagnosis not present

## 2013-07-27 DIAGNOSIS — Z720 Tobacco use: Secondary | ICD-10-CM | POA: Diagnosis present

## 2013-07-27 DIAGNOSIS — Z8249 Family history of ischemic heart disease and other diseases of the circulatory system: Secondary | ICD-10-CM

## 2013-07-27 DIAGNOSIS — I2119 ST elevation (STEMI) myocardial infarction involving other coronary artery of inferior wall: Principal | ICD-10-CM

## 2013-07-27 DIAGNOSIS — I213 ST elevation (STEMI) myocardial infarction of unspecified site: Secondary | ICD-10-CM

## 2013-07-27 DIAGNOSIS — L408 Other psoriasis: Secondary | ICD-10-CM | POA: Diagnosis present

## 2013-07-27 DIAGNOSIS — J4489 Other specified chronic obstructive pulmonary disease: Secondary | ICD-10-CM | POA: Diagnosis present

## 2013-07-27 HISTORY — DX: Tobacco use: Z72.0

## 2013-07-27 HISTORY — DX: Atherosclerotic heart disease of native coronary artery without angina pectoris: I25.10

## 2013-07-27 HISTORY — PX: LEFT HEART CATHETERIZATION WITH CORONARY ANGIOGRAM: SHX5451

## 2013-07-27 LAB — COMPREHENSIVE METABOLIC PANEL
ALT: 16 U/L (ref 0–53)
ALT: 20 U/L (ref 0–53)
AST: 22 U/L (ref 0–37)
Albumin: 3.1 g/dL — ABNORMAL LOW (ref 3.5–5.2)
Alkaline Phosphatase: 125 U/L — ABNORMAL HIGH (ref 39–117)
BUN: 11 mg/dL (ref 6–23)
CO2: 23 mEq/L (ref 19–32)
CO2: 23 mEq/L (ref 19–32)
Chloride: 104 mEq/L (ref 96–112)
Creatinine, Ser: 0.62 mg/dL (ref 0.50–1.35)
GFR calc non Af Amer: 85 mL/min — ABNORMAL LOW (ref >90)
GFR calc non Af Amer: 89 mL/min — ABNORMAL LOW (ref >90)
Glucose, Bld: 103 mg/dL — ABNORMAL HIGH (ref 70–99)
Potassium: 4.1 mEq/L (ref 3.5–5.1)
Sodium: 139 mEq/L (ref 135–145)
Sodium: 139 mEq/L (ref 135–145)
Total Bilirubin: 0.3 mg/dL (ref 0.3–1.2)
Total Protein: 6.5 g/dL (ref 6.0–8.3)
Total Protein: 7.5 g/dL (ref 6.0–8.3)

## 2013-07-27 LAB — POCT I-STAT, CHEM 8
BUN: 12 mg/dL (ref 6–23)
Calcium, Ion: 1.19 mmol/L (ref 1.13–1.30)
Chloride: 103 mEq/L (ref 96–112)
Glucose, Bld: 104 mg/dL — ABNORMAL HIGH (ref 70–99)
HCT: 44 % (ref 39.0–52.0)
Sodium: 141 mEq/L (ref 135–145)
TCO2: 27 mmol/L (ref 0–100)

## 2013-07-27 LAB — POCT I-STAT TROPONIN I: Troponin i, poc: 0.04 ng/mL (ref 0.00–0.08)

## 2013-07-27 LAB — CBC
HCT: 37.5 % — ABNORMAL LOW (ref 39.0–52.0)
Hemoglobin: 12.8 g/dL — ABNORMAL LOW (ref 13.0–17.0)
MCH: 33.1 pg (ref 26.0–34.0)
MCHC: 34.1 g/dL (ref 30.0–36.0)
MCV: 96.9 fL (ref 78.0–100.0)
Platelets: 173 K/uL (ref 150–400)
RBC: 3.87 MIL/uL — ABNORMAL LOW (ref 4.22–5.81)
RDW: 13.9 % (ref 11.5–15.5)
WBC: 8.8 K/uL (ref 4.0–10.5)

## 2013-07-27 LAB — LIPID PANEL
LDL Cholesterol: 65 mg/dL (ref 0–99)
Triglycerides: 64 mg/dL (ref <150)

## 2013-07-27 LAB — APTT
aPTT: 27 seconds (ref 24–37)
aPTT: >200 seconds (ref 24–37)

## 2013-07-27 LAB — COMPREHENSIVE METABOLIC PANEL WITH GFR
AST: 31 U/L (ref 0–37)
Albumin: 3.5 g/dL (ref 3.5–5.2)
BUN: 11 mg/dL (ref 6–23)
Calcium: 9.2 mg/dL (ref 8.4–10.5)
Chloride: 103 meq/L (ref 96–112)
Creatinine, Ser: 0.7 mg/dL (ref 0.50–1.35)
GFR calc Af Amer: >90 mL/min (ref >90)

## 2013-07-27 LAB — CK TOTAL AND CKMB (NOT AT ARMC)
CK, MB: 2.6 ng/mL (ref 0.3–4.0)
Total CK: 43 U/L (ref 7–232)

## 2013-07-27 LAB — TROPONIN I: Troponin I: <0.3 ng/mL (ref <0.30)

## 2013-07-27 LAB — PROTIME-INR
INR: 1.26 (ref 0.00–1.49)
INR: 8.23 (ref 0.00–1.49)
INR: 2.02 — ABNORMAL HIGH (ref 0.00–1.49)
Prothrombin Time: 15.5 seconds — ABNORMAL HIGH (ref 11.6–15.2)
Prothrombin Time: 65 seconds — ABNORMAL HIGH (ref 11.6–15.2)
Prothrombin Time: 22.2 seconds — ABNORMAL HIGH (ref 11.6–15.2)

## 2013-07-27 LAB — HEMOGLOBIN A1C: Hgb A1c MFr Bld: 5.8 % — ABNORMAL HIGH (ref <5.7)

## 2013-07-27 SURGERY — LEFT HEART CATHETERIZATION WITH CORONARY ANGIOGRAM
Anesthesia: LOCAL

## 2013-07-27 MED ORDER — HEPARIN (PORCINE) IN NACL 2-0.9 UNIT/ML-% IJ SOLN
INTRAMUSCULAR | Status: AC
  Filled 2013-07-27: qty 1500

## 2013-07-27 MED ORDER — ASPIRIN 81 MG PO CHEW
81.0000 mg | CHEWABLE_TABLET | Freq: Every day | ORAL | Status: DC
  Administered 2013-07-28 – 2013-07-29 (×2): 81 mg via ORAL
  Filled 2013-07-27 (×3): qty 1

## 2013-07-27 MED ORDER — TICAGRELOR 90 MG PO TABS
ORAL_TABLET | ORAL | Status: AC
  Filled 2013-07-27: qty 1

## 2013-07-27 MED ORDER — ATROPINE SULFATE 0.1 MG/ML IJ SOLN
INTRAMUSCULAR | Status: AC
  Filled 2013-07-27: qty 10

## 2013-07-27 MED ORDER — ACETAMINOPHEN 325 MG PO TABS: 650.0000 mg | ORAL_TABLET | ORAL | Status: DC | PRN

## 2013-07-27 MED ORDER — OXYCODONE-ACETAMINOPHEN 5-325 MG PO TABS: 1.0000 | ORAL_TABLET | ORAL | Status: DC | PRN

## 2013-07-27 MED ORDER — ONDANSETRON HCL 4 MG/2ML IJ SOLN: 4.0000 mg | Freq: Four times a day (QID) | INTRAMUSCULAR | Status: DC | PRN

## 2013-07-27 MED ORDER — TICAGRELOR 90 MG PO TABS
90.0000 mg | ORAL_TABLET | Freq: Two times a day (BID) | ORAL | Status: DC
  Administered 2013-07-27 – 2013-07-29 (×4): 90 mg via ORAL
  Filled 2013-07-27 (×6): qty 1

## 2013-07-27 MED ORDER — VERAPAMIL HCL 2.5 MG/ML IV SOLN
INTRAVENOUS | Status: AC
  Filled 2013-07-27: qty 2

## 2013-07-27 MED ORDER — SODIUM CHLORIDE 0.9 % IV SOLN
INTRAVENOUS | Status: AC
  Administered 2013-07-27: 16:00:00 via INTRAVENOUS

## 2013-07-27 MED ORDER — SODIUM CHLORIDE 0.9 % IV SOLN: INTRAVENOUS | Status: DC

## 2013-07-27 MED ORDER — HEPARIN SODIUM (PORCINE) 5000 UNIT/ML IJ SOLN
INTRAMUSCULAR | Status: AC
  Administered 2013-07-27: 4000 [IU]
  Filled 2013-07-27: qty 1

## 2013-07-27 MED ORDER — ONDANSETRON HCL 4 MG/2ML IJ SOLN
INTRAMUSCULAR | Status: AC
  Filled 2013-07-27: qty 2

## 2013-07-27 MED ORDER — ASPIRIN 81 MG PO CHEW
CHEWABLE_TABLET | ORAL | Status: AC
  Filled 2013-07-27: qty 4

## 2013-07-27 MED ORDER — ASPIRIN 81 MG PO CHEW
324.0000 mg | CHEWABLE_TABLET | Freq: Once | ORAL | Status: AC
  Administered 2013-07-27: 324 mg via ORAL

## 2013-07-27 MED ORDER — HEPARIN SODIUM (PORCINE) 5000 UNIT/ML IJ SOLN: 60.0000 [IU]/kg | INTRAMUSCULAR | Status: DC

## 2013-07-27 MED ORDER — NITROGLYCERIN 0.2 MG/ML ON CALL CATH LAB
INTRAVENOUS | Status: AC
  Filled 2013-07-27: qty 1

## 2013-07-27 MED ORDER — LIDOCAINE HCL (PF) 1 % IJ SOLN
INTRAMUSCULAR | Status: AC
  Filled 2013-07-27: qty 30

## 2013-07-27 MED ORDER — MIDAZOLAM HCL 2 MG/2ML IJ SOLN
INTRAMUSCULAR | Status: AC
  Filled 2013-07-27: qty 2

## 2013-07-27 MED ORDER — FENTANYL CITRATE 0.05 MG/ML IJ SOLN
INTRAMUSCULAR | Status: AC
  Filled 2013-07-27: qty 2

## 2013-07-27 NOTE — ED Notes (Signed)
PT reports N/V today . Pt denies Pain

## 2013-07-27 NOTE — CV Procedure (Signed)
Cardiac Catheterization Procedure Note  Name: Jon Hicks MRN: [id number] DOB: July 24, 1928  Procedure: Left Heart Cath, Selective Coronary Angiography, LV angiography, PTCA and stenting of the RCA (distal)  Indication: Acute inferolateral STEMI. 79 year old gentleman who presented with an acute inferolateral wall ST segment elevation MI. His pain started at 10 AM today. He arrived by private vehicle and was promptly seen in the emergency department and a code STEMI was activated. The patient received 4000 units of unfractionated heparin and for chewable 81 mg aspirin in the emergency department. He was transported emergently for cardiac catheterization and PCI.  Procedural Details:  The right wrist was prepped, draped, and anesthetized with 1% lidocaine. Using the modified Seldinger technique, a 5/6 French sheath was introduced into the right radial artery. 3 mg of verapamil was administered through the sheath, weight-based bivalirudin was administered intravenously. The patient was given brilinta 180 mg orally. Standard Judkins catheters were used for selective coronary angiography and left ventriculography. Catheter exchanges were performed over an exchange length guidewire.  PROCEDURAL FINDINGS Hemodynamics: AO 125/70 LV 124/9   Coronary angiography: Coronary dominance: right  Left mainstem: Patent with 20-30% distal left main stenosis.  The left main arises from the left cusp it divides into the LAD and left circumflex.  Left anterior descending (LAD): The LAD is moderately calcified. The vessel is diffusely diseased with moderate mid vessel stenosis. There is some intramyocardial bridging present. I would estimate the mid vessel stenosis at 50-60%. The proximal vessel has diffuse 40-50% stenosis. The first diagonal has 75-80% ostial stenosis.  Left circumflex (LCx): The circumflex is medium in caliber. The vessel gives off a single OM branch with no significant stenosis. The  proximal circumflex has a dilated segment followed by 30-40% stenosis.  Right coronary artery (RCA): The right coronary artery is a large, dominant vessel. There are diffuse irregularities in the proximal vessel. There is mild calcification present. The distal RCA is totally occluded with contrast staining suggestive of acute occlusion. There is no collateral filling of the PDA or PLB.  Left ventriculography: Left ventricular systolic function is low normal. There is hypokinesis of the basal and midinferior walls. The other LV wall segments are normal.  PCI Note:  Following the diagnostic procedure, the decision was made to proceed with PCI. Weight-based bivalirudin was given for anticoagulation. Once a therapeutic ACT was achieved, a 6 [place] JR 4 guide catheter was inserted.  A cougar coronary guidewire was used to cross the lesion.  The lesion was predilated with a 2.5 x 15 mm balloon.  The lesion was then stented with a 3.5 x 20 mm Promus premier drug-eluting stent.  The stent was postdilated with a 3.75 mm noncompliant balloon.  Following PCI, there was 0% residual stenosis and TIMI-3 flow. Final angiography confirmed an excellent result. The patient tolerated the procedure well. There were no immediate procedural complications. A TR band was used for radial hemostasis. The patient was transferred to the post catheterization recovery area for further monitoring.  PCI Data: Vessel - RCA/Segment - distal Percent Stenosis (pre)  100 TIMI-flow 0 Stent 3.5 x 20 mm Promus DES Percent Stenosis (post) 0 TIMI-flow (post) 3  Final Conclusions:   1. Acute inferolateral myocardial infarction secondary to acute occlusion of the distal RCA, treated successfully with primary PCI using a drug-eluting stent 2. Moderate LAD stenosis 3. Mild left circumflex stenosis 4. Mild segmental left ventricular dysfunction with preserved overall LVEF   Recommendations:  The patient will require dual antiplatelet  therapy with aspirin and brilinta for at least one year. He did not have good ST segment resolution at the completion of the procedure. I suspect this is related to distal embolization as the distal most portions of the PLA branches have that appearance. Despite his residual ST elevation, his chest pain is markedly improved. Will continue bivalirudin for 2 hours.  Tonny Bollman 07/27/2013, 2:54 PM

## 2013-07-27 NOTE — Progress Notes (Signed)
Chaplain responded to code stemi in the cath lab.  Upon arrival, nursing from the ED brought up the wife of the pt.  Chaplain sat down and talked with the wife upon her arrival.  About 15 min. Into their conversation, chaplain went to cath lab to check on the status of the pt in order to promote information sharing.   After nursing brought the wife up to speed, the chaplain spoke to the wife of the pt for some time.  Chaplain provided empathic listening and a ministry of presence.  Wife of the pt seemed grateful for the chaplain visit.  Follow up possible on request of need.

## 2013-07-27 NOTE — H&P (Signed)
History and Physical  Patient ID: Jon Hicks MRN: [id number], SOB: Jan 20, 1928 79 y.o. Date of Encounter: 07/27/2013, 2:14 PM  Primary Physician: Oliver Barre, MD Primary Cardiologist: None  Chief Complaint: STEMI  HPI: 79 y.o. male w/ PMHx significant for COPD, GERD, psoriasis, allergic rhinitis, and current tobacco abuse presented to [facility] on 07/27/2013 with complaints of chest pain.  Pt presented to the ED with a constant, substernal chest pain radiating into his left arm and shoulder that began around 4 hours prior to his presentation to the ED. He was also experiencing diaphoresis, dizziness, nausea, and vomiting. Pt without cardiac history but father with heart disease.  On arrival to the ED, EKG revealed ST elevation in the inferolateral leads. Labs without significant findings. ASA and heparin were initiated and a Code STEMI was called. Pt was taken emergently to the cath lab for a left heart catheterization.   Past Medical History  Diagnosis Date  . COPD (chronic obstructive pulmonary disease)   . Psoriasis   . DJD (degenerative joint disease)     (no psoriatic arthritis per pt.); hx of left knee cortisone at [facility]  . Prostatitis     hx  . GERD (gastroesophageal reflux disease)   . Allergic rhinitis   . ALLERGIC RHINITIS 11/22/2009  . COPD 11/22/2009  . GERD 11/22/2009  . PSORIASIS 11/22/2009  . Vitamin D deficiency 12/01/2010     Surgical History:  Past Surgical History  Procedure Laterality Date  . Cholecystectomy    . Edg      Dr. Rhea Belton esophageal nodule-normal tissue, 2000  . Cardiolite      normal exercise cardiolite 1999  . Urtheral      s/p urtheral stricture dilation-per urology-Dr. Alto Denver 1984  . Herniorrhapy      inguinal herniorrhapy-bilat     Home Meds: Prior to Admission medications   Medication Sig Start Date End Date Taking? Authorizing Provider  Clindamycin Phosphate foam Apply topically 2 (two) times daily.       Historical Provider, MD  clobetasol (TEMOVATE) 0.05 % cream Apply topically as directed.      Historical Provider, MD  CLOBETASOL PROPIONATE E 0.05 % emollient cream Apply 1 application topically daily. 01/02/13   Historical Provider, MD  desonide (DESOWEN) 0.05 % cream Apply topically 2 (two) times daily.    Historical Provider, MD  ibuprofen (ADVIL) 200 MG tablet Take 200 mg by mouth every 6 (six) hours as needed.      Historical Provider, MD  loratadine (CLARITIN) 10 MG tablet Take 10 mg by mouth daily.      Historical Provider, MD  Multiple Vitamin (MULTIVITAMIN) tablet Take 1 tablet by mouth daily.    Historical Provider, MD  SPIRIVA HANDIHALER 18 MCG inhalation capsule PLACE 1 CAPSULE (18 MCG TOTAL) INTO INHALER AND INHALE DAILY. 01/24/12   Corwin Levins, MD    Allergies: No Known Allergies  History   Social History  . Marital Status: Married    Spouse Name: N/A    Number of Children: N/A  . Years of Education: N/A   Occupational History  . Retired     [employer] 1995   Social History Main Topics  . Smoking status: Current Every Day Smoker    Types: Cigars  . Smokeless tobacco: Never Used  . Alcohol Use: No  . Drug Use: No  . Sexual Activity: Not on file   Other Topics Concern  . Not on file  Social History Narrative  . No narrative on file     Family History  Problem Relation Age of Onset  . Arthritis Mother   . Heart disease Father     MI    Review of Systems: Unable to perform a ROS as the patient is currently undergoing emergent heart catheterization.  Physical Exam: Blood pressure 109/25. General: Cachectic male HEENT: Normocephalic, atraumatic, EOMI Neck: Supple. Normal ROM Lungs: Breathing is unlabored. Heart: Bradycardic Abdomen: Non-distended  Msk:  Strength and tone appear normal for age. Extremities: Able to move all 4 extremities Neuro: Nonfocal   Labs:   Lab Results  Component Value Date   WBC 8.8 07/27/2013   HGB 15.0 07/27/2013    HCT 44.0 07/27/2013   MCV 96.9 07/27/2013   PLT 173 07/27/2013    Recent Labs Lab 07/27/13 1402  NA 141  K 4.0  CL 103  BUN 12  CREATININE 0.90  GLUCOSE 104*   No results found for this basename: CKTOTAL, CKMB, TROPONINI,  in the last 72 hours Lab Results  Component Value Date   CHOL 150 06/17/2013   HDL 59.70 06/17/2013   LDLCALC 71 06/17/2013   TRIG 98.0 06/17/2013   No results found for this basename: DDIMER    Radiology/Studies:  No results found.   EKG:  07/27/13 Inferolateral ST elevation MI  ASSESSMENT AND PLAN:  79yo M with PMH allergic rhinitis, psoriasis, GERD, and COPD presented to [facility] with an acute inferolateral STEMI.  1. STEMI: Pt without cardiac history or know h/o HTN or diabetes. He is not on ASA at home. His last lipid panel was 06/17/13 with normal cholesterol levels. Pt presented after 4 hours of constant substernal chest pain with radiation into his left arm. Significant ST elevations in inferior and lateral leads. Pt was taken emergently to the cath lab for left heart catheterization with Dr. Excell Seltzer. Further intervention pending heart cath.   2. Tobacco abuse: Pt is a current smoker of unknown amount. Will discuss tobacco cessation with the patient once he is post cath.  3. COPD: Pt on Spiriva at home. Holding for now. Will resume home meds once pt more alert and able to tolerate.   4. GERD: Pt with h/o GERD, but not on maintenance medication at home.  5. Psoriasis: Pt on clobetasol topical cream at home. Holding for now.     Signed, Genelle Gather, MD Internal Medicine Resident, PGY II [facility] Internal Medicine Program 07/27/2013 2:33 PM   Patient seen, examined. Available data reviewed. Agree with findings, assessment, and plan as outlined by Charlsie Merles, MD. The patient has ongoing chest pain and a diagnostic EKG for an inferolateral STEMI. Emergency cath and PCI planned. Discussed plan with patient and his wife who  understand.   Tonny Bollman, M.D. 07/27/2013 3:02 PM

## 2013-07-27 NOTE — ED Provider Notes (Signed)
CSN: [id number]     Arrival date & time 07/27/13  1334 History   First MD Initiated Contact with Patient 07/27/13 1349     Chief Complaint  Patient presents with  . Chest Pain   (Consider location/radiation/quality/duration/timing/severity/associated sxs/prior Treatment) HPI Comments: Level 5 caveat due to severe illness  Patient is a 79 y.o. male presenting with chest pain. The history is provided by the patient.  Chest Pain Pain location:  Substernal area and L chest Pain quality: aching   Pain radiates to:  L arm and L shoulder Pain radiates to the back: no   Pain severity:  Moderate Onset quality:  Sudden Duration:  4 hours Timing:  Constant Progression:  Unchanged Chronicity:  New Associated symptoms: diaphoresis, dizziness, nausea and vomiting   Risk factors: male sex and smoking   Risk factors: no coronary artery disease and no diabetes mellitus     Past Medical History  Diagnosis Date  . COPD (chronic obstructive pulmonary disease)   . Psoriasis   . DJD (degenerative joint disease)     (no psoriatic arthritis per pt.); hx of left knee cortisone at [facility]  . Prostatitis     hx  . GERD (gastroesophageal reflux disease)   . Allergic rhinitis   . ALLERGIC RHINITIS 11/22/2009  . COPD 11/22/2009  . GERD 11/22/2009  . PSORIASIS 11/22/2009  . Vitamin D deficiency 12/01/2010   Past Surgical History  Procedure Laterality Date  . Cholecystectomy    . Edg      Dr. Rhea Belton esophageal nodule-normal tissue, 2000  . Cardiolite      normal exercise cardiolite 1999  . Urtheral      s/p urtheral stricture dilation-per urology-Dr. Alto Denver 1984  . Herniorrhapy      inguinal herniorrhapy-bilat   Family History  Problem Relation Age of Onset  . Arthritis Mother   . Heart disease Father     MI   History  Substance Use Topics  . Smoking status: Current Every Day Smoker    Types: Cigars  . Smokeless tobacco: Never Used  . Alcohol Use: No    Review of Systems   Unable to perform ROS: Acuity of condition  Constitutional: Positive for diaphoresis.  Cardiovascular: Positive for chest pain.  Gastrointestinal: Positive for nausea and vomiting.  Neurological: Positive for dizziness.    Allergies  Review of patient's allergies indicates no known allergies.  Home Medications   No current outpatient prescriptions on file. BP 109/25 Physical Exam  Nursing note and vitals reviewed. Constitutional: He is oriented to person, place, and time. He appears well-developed. He appears cachectic.  Non-toxic appearance.  HENT:  Head: Normocephalic and atraumatic.  Eyes: EOM are normal.  Neck: Normal range of motion. Neck supple.  Cardiovascular: Regular rhythm and intact distal pulses.  Bradycardia present.   No murmur heard. Pulmonary/Chest: Effort normal. He has no wheezes. He has no rales.  Abdominal: Soft. He exhibits no distension. There is no tenderness. There is no rebound.  Musculoskeletal: He exhibits no edema.  Neurological: He is oriented to person, place, and time. He exhibits normal muscle tone.  Skin: Skin is warm and dry. He is not diaphoretic.    ED Course  Procedures (including critical care time) Labs Review Labs Reviewed  APTT  CBC  COMPREHENSIVE METABOLIC PANEL  PROTIME-INR   Imaging Review No results found.  EKG Interpretation    Date/Time:  Monday July 27 2013 13:37:55 EST Ventricular Rate:  43 PR Interval:  192 QRS  Duration: 84 QT Interval:  480 QTC Calculation: 405 R Axis:   71 Text Interpretation:   Critical Test Result: STEMI Marked sinus bradycardia ST elevation consider inferolateral injury or acute infarct ** ** ACUTE MI / STEMI ** ** Consider right ventricular involvement in acute inferior infarct Abnormal ECG changed from prior ECG            MDM   1. STEMI (ST elevation myocardial infarction)    Pt with onset of CP, dizziness, N/V.  ECG shows inferior wall STEMI with reciprocal changes,  bradycardia. Will give ASA, heparin bolus, code STEMI called.  Spoke to Dr. Excell Seltzer who will take pt to the cath lab.  Pt deneis current nausea, 2 IV's placed.  Pads placed.    Gavin Pound. Shamell Hittle, MD 07/27/13 1401

## 2013-07-27 NOTE — Care Management Note (Addendum)
    Page 1 of 1   07/28/2013     11:26:06 AM   CARE MANAGEMENT NOTE 07/28/2013  Patient:  Jon Hicks, Jon Hicks   Account Number:  [id number]  Date Initiated:  07/27/2013  Documentation initiated by:  Junius Creamer  Subjective/Objective Assessment:   adm w mi     Action/Plan:   lives w wife, pcp dr Oliver Barre   Anticipated DC Date:     Anticipated DC Plan:        DC Planning Services  CM consult  Medication Assistance      Choice offered to / List presented to:             Status of service:  Completed, signed off Medicare Important Message given?   (If response is "NO", the following Medicare IM given date fields will be blank) Date Medicare IM given:   Date Additional Medicare IM given:    Discharge Disposition:  HOME/SELF CARE  Per UR Regulation:  Reviewed for med. necessity/level of care/duration of stay  If discussed at Long Length of Stay Meetings, dates discussed:    Comments:  07-28-13 1123  Brilinta is a tier 3 medication, retail co-pay 43.00, no auth required. Will make pt aware of cost. Please write Rx for 30 day free no refills and the original Rx with refills. Thanks Gala Lewandowsky, [place] [phone]  11/17 1537 debbie dowell rn,bsn left pt brilinta 30day free card.

## 2013-07-28 DIAGNOSIS — I059 Rheumatic mitral valve disease, unspecified: Secondary | ICD-10-CM

## 2013-07-28 LAB — BASIC METABOLIC PANEL
BUN: 9 mg/dL (ref 6–23)
BUN: 8 mg/dL (ref 6–23)
CO2: 26 mEq/L (ref 19–32)
Calcium: 8.6 mg/dL (ref 8.4–10.5)
Calcium: 8.7 mg/dL (ref 8.4–10.5)
Chloride: 104 mEq/L (ref 96–112)
Creatinine, Ser: 0.55 mg/dL (ref 0.50–1.35)
Creatinine, Ser: 0.59 mg/dL (ref 0.50–1.35)
GFR calc Af Amer: >90 mL/min (ref >90)
GFR calc non Af Amer: >90 mL/min (ref >90)
Glucose, Bld: 86 mg/dL (ref 70–99)
Potassium: 4.2 mEq/L (ref 3.5–5.1)

## 2013-07-28 LAB — CBC
HCT: 34.9 % — ABNORMAL LOW (ref 39.0–52.0)
Hemoglobin: 12.1 g/dL — ABNORMAL LOW (ref 13.0–17.0)
MCH: 33.6 pg (ref 26.0–34.0)
MCHC: 34.7 g/dL (ref 30.0–36.0)
MCV: 96.9 fL (ref 78.0–100.0)
WBC: 8.4 10*3/uL (ref 4.0–10.5)

## 2013-07-28 LAB — CK TOTAL AND CKMB (NOT AT ARMC): Total CK: 871 U/L — ABNORMAL HIGH (ref 7–232)

## 2013-07-28 LAB — POCT ACTIVATED CLOTTING TIME: Activated Clotting Time: 380 seconds

## 2013-07-28 MED ORDER — ATORVASTATIN CALCIUM 40 MG PO TABS
40.0000 mg | ORAL_TABLET | Freq: Every day | ORAL | Status: DC
  Filled 2013-07-28: qty 1

## 2013-07-28 MED ORDER — ATORVASTATIN CALCIUM 20 MG PO TABS
20.0000 mg | ORAL_TABLET | Freq: Every day | ORAL | Status: DC
  Administered 2013-07-28: 20 mg via ORAL
  Filled 2013-07-28 (×3): qty 1

## 2013-07-28 NOTE — Progress Notes (Signed)
Pt refused to wear scds applied at the beginning of the shift even after explaining the purpose and benefits of its use.

## 2013-07-28 NOTE — Progress Notes (Signed)
CARDIAC REHAB PHASE I   PRE:  Rate/Rhythm: 64 SR    BP: sitting 134/80    SaO2: 100 RA  MODE:  Ambulation: 350 ft   POST:  Rate/Rhythm: 71 SR    BP: sitting 122/80     SaO2: 98 RA  Tolerated well, no c/o. BP lower after walk, denied dizziness. Ed completed with wife and son present. Voiced understanding. Son asked questions about smoking cessation. Pt and wife just listened. Gave cessation handout. Interested in [facility] and will send referral to [place] [facility]. [id number] Jon Hicks CES, ACSM 07/28/2013 2:03 PM

## 2013-07-28 NOTE — Progress Notes (Signed)
CTSP secondary to run of Nonsustained VTACH 10 beats at about 150bpm.  Patient was asymptomatic.  potassium and mag are in normal range.  No further arrhythmias.  Will hold on beta blocker or amio since patient was asymptomatic and nonsustained.  He is bradycardic at present.

## 2013-07-28 NOTE — Progress Notes (Signed)
Met with patient at bedside to explain and offer [facility] Care Management services. Reports he lives with wife and plans to return home upon discharge. Consents were signed. Explained to Mr Seeley that he will receive post hospital discharge and will be evaluated for monthly home visits. Left [facility] Care Management packet at bedside. Made inpatient RNCM aware [facility] Care Management will follow.  Raiford Noble, MSN-Ed, RN,BSN-- [facility] [phone]

## 2013-07-28 NOTE — Progress Notes (Signed)
  Echocardiogram 2D Echocardiogram has been performed.  Jon Hicks 07/28/2013, 10:18 AM

## 2013-07-28 NOTE — Progress Notes (Signed)
Subjective:  Overnight 10 beats of nonsustained VTACH at about 150bpm, pt asymptomatic. Pt denies any chest pain, increased SOB above his baseline, or N/V.  Objective:  Vital Signs in the last 24 hours: Temp:  [97.5 F (36.4 C)-98.9 F (37.2 C)] 97.5 F (36.4 C) (11/18 0400) Pulse Rate:  [76] 76 (11/17 1545) Resp:  [12-22] 12 (11/18 0600) BP: (101-144)/(25-82) 123/53 mmHg (11/18 0600) SpO2:  [91 %-100 %] 95 % (11/18 0600) Weight:  [131 lb 9.8 oz (59.7 kg)] 131 lb 9.8 oz (59.7 kg) (11/17 1545)  Intake/Output from previous day: 11/17 0701 - 11/18 0700 In: 1140 [P.O.:540; I.V.:600] Out: 900 [Urine:900]  Physical Exam: Pt is alert and oriented, NAD HEENT: normal Neck: JVP appears normal, carotids 2+ without bruits Lungs: Mild rhonchi at the left base, otherwise clear with diminished breath sounds throughout CV: RRR without murmur or gallop Abd: soft, NT, Positive BS Ext: no C/C/E, distal pulses intact and equal Skin: Scattered ecchymoses across the chest   Lab Results:  Recent Labs  07/27/13 1355 07/27/13 1402 07/28/13 0500  WBC 8.8  --  8.4  HGB 12.8* 15.0 12.1*  PLT 173  --  156    Recent Labs  07/27/13 2355 07/28/13 0500  NA 136 138  K 5.4* 4.2  CL 104 104  CO2 22 26  GLUCOSE 82 86  BUN 9 8  CREATININE 0.55 0.59    Recent Labs  07/27/13 1415  TROPONINI <0.30    Cardiac Studies:  07/27/13 Left heart catheterization Final Conclusions:  1. Acute inferolateral myocardial infarction secondary to acute occlusion of the distal RCA, treated successfully with primary PCI using a drug-eluting stent  2. Moderate LAD stenosis  3. Mild left circumflex stenosis  4. Mild segmental left ventricular dysfunction with preserved overall LVEF    Tele: Sinus bradycardia with 10 beats of VTACH at 150bpm overnight.  Assessment/Plan:  79yo M with PMH COPD, GERD, psoriasis, allergic rhinitis, and current tobacco abuse presented to [facility] on  07/27/2013 with an acute inferolateral STEMI.  1. STEMI: Pt without cardiac history or know h/o HTN, diabetes, or HLD. A1c was 5.8 and lipid panel was normal this admission. He does smoke 7 cigars a day over the past 30+ years. He is not on ASA at home. Pt presented after 4 hours of constant substernal chest pain with radiation into his left arm. Significant ST elevations in inferior and lateral leads. Pt was taken emergently to the cath lab for left heart catheterization with Dr. Excell Seltzer where a PTCA/DES x1 was placed in the distal RCA 2/2 to acute occlusion. The pt was placed on ASA and Brilinta which he will need to continue for 1 year. Starting statin. He curretnly denies chest pain or increased SOB above his baseline with is COPD, and his EKG is greatly improved this morning. Will check 2D ECHO today to assess ventricular function and for wall motion abnormalities.   2. Tobacco abuse: Pt is a current smoker of approximately 7 cigars/day for the past 30 years. Tobacco cessation was discussed with the patient this admission. Pt is contemplating cessation.  3. COPD: Pt on Spiriva at home, which was restarted post cath.  4. GERD: Pt with h/o GERD, but not on maintenance medication at home. Will monitor.  5. Psoriasis: Pt on clobetasol topical cream at home. Holding for now.    Genelle Gather, M.D. 07/28/2013, 6:35 AM   Patient seen, examined. Available data reviewed. Agree with findings, assessment,  and plan as outlined by Charlsie Merles, MD. The patient is doing well day #1 post-MI. No pain recurrence. Exam reveals elderly male in NAD. Lungs clear but prolonged expiration. CV - distant and regular. No edema. Right radial site is clear. Agree with plan as above. Tx tele today. Discussed tobacco cessation and I don't think he will quit. Probable d/c home tomorrow. Bradycardia precludes use of beta-blocker.   Tonny Bollman, M.D. 07/28/2013 8:17 AM

## 2013-07-29 ENCOUNTER — Encounter (HOSPITAL_COMMUNITY): Payer: Self-pay | Admitting: Physician Assistant

## 2013-07-29 ENCOUNTER — Other Ambulatory Visit: Payer: Self-pay | Admitting: Physician Assistant

## 2013-07-29 DIAGNOSIS — Z720 Tobacco use: Secondary | ICD-10-CM | POA: Diagnosis present

## 2013-07-29 DIAGNOSIS — I2510 Atherosclerotic heart disease of native coronary artery without angina pectoris: Secondary | ICD-10-CM | POA: Diagnosis present

## 2013-07-29 MED ORDER — TICAGRELOR 90 MG PO TABS: 90.0000 mg | ORAL_TABLET | Freq: Two times a day (BID) | ORAL | Status: DC

## 2013-07-29 MED ORDER — NITROGLYCERIN 0.4 MG SL SUBL: 0.4000 mg | SUBLINGUAL_TABLET | SUBLINGUAL | Status: DC | PRN

## 2013-07-29 MED ORDER — ATORVASTATIN CALCIUM 20 MG PO TABS: 20.0000 mg | ORAL_TABLET | Freq: Every day | ORAL | Status: DC

## 2013-07-29 MED ORDER — ASPIRIN 81 MG PO CHEW: 81.0000 mg | CHEWABLE_TABLET | Freq: Every day | ORAL | Status: DC

## 2013-07-29 NOTE — Discharge Summary (Signed)
Discharge Summary   Patient ID: Jon Hicks MRN: [id number], DOB/AGE: 1928/07/12 79 y.o. Admit date: 07/27/2013 D/C date:     07/29/2013  Primary Cardiologist: Dr. Excell Seltzer  Active Problems:   CAD (coronary artery disease)  - s/p acute inferolateral wall STEMI on this admission   COPD   Tobacco abuse   GERD   PSORIASIS   Hospital Course: [name] is an 80 y.o. male with a history of COPD, GERD, psoriasis, and current tobacco abuse who presented to [facility] on 07/27/2013 with acute inferolateral wall ST segment elevation MI. Patient had no prior cardiac history, HTN, HLD or DM.  Pt was taken emergently to the cath lab for a left heart catheterization.  PTCA/DES x1 was placed in the distal RCA 2/2 to acute occlusion. Cardiac cath revealed moderate LAD stenosis, mild LCx stenosis and mild segmental left ventricular dysfunction with preserved overall LVEF. ECHO on 07/28/13 revealed EF of 55% to 60% with new wall motion abnormality since last ECHO 12/2010. Labs on this admission revealed a Hg a1c of 5.8 and normal lipid panel. Pt is a current cigar smoker, approx 7/day. Tobacco cessation was discussed at great lengths and he is contemplating cessation.  [name] had an uncomplicated hospital course and is feeling well. Dr. Excell Seltzer deemed him stable for discharge today . His blood pressure is low with resting bradycardia, so it was felt that he would not tolerate a beta-blocker or ACE-I. He will be discharged on ASA and brilinta for at least 12 months. We will also treat him with a low-dose statin even though his lipids are ideal on no therapy (acute MI secondary risk reduction). Follow-up appointment has been made with cardiology.       Discharge Vitals: Blood pressure 124/50, pulse 52, temperature 98.5 F (36.9 C), temperature source Oral, resp. rate 18, height 5\' 11"  (1.803 m), weight 131 lb 9.8 oz (59.7 kg), SpO2 98.00%.  Labs: Lab Results  Component Value Date   WBC 8.4  07/28/2013   HGB 12.1* 07/28/2013   HCT 34.9* 07/28/2013   MCV 96.9 07/28/2013   PLT 156 07/28/2013    Recent Labs Lab 07/27/13 1415  07/28/13 0500  NA 139  < > 138  K 3.6  < > 4.2  CL 104  < > 104  CO2 23  < > 26  BUN 11  < > 8  CREATININE 0.62  < > 0.59  CALCIUM 8.6  < > 8.7  PROT 6.5  --   --   BILITOT 0.3  --   --   ALKPHOS 109  --   --   ALT 16  --   --   AST 22  --   --   GLUCOSE 114*  < > 86  < > = values in this interval not displayed.  Recent Labs  07/27/13 1415 07/28/13 0500  CKTOTAL 43 871*  CKMB 2.6 99.8*  TROPONINI <0.30  --    Lab Results  Component Value Date   CHOL 141 07/27/2013   HDL 63 07/27/2013   LDLCALC 65 07/27/2013   TRIG 64 07/27/2013     Diagnostic Studies/Procedures   Dg Chest Port 1 View: 07/27/2013   CLINICAL DATA:  Shortness of breath.  EXAM: PORTABLE CHEST - 1 VIEW  COMPARISON:  06/13/2012  FINDINGS: The cardiac silhouette is mild-to-moderately enlarged. There is prominence of the interstitial markings, cephalization, peribronchial cuffing. No focal regions of consolidation or focal infiltrates. Dextroscoliosis identified within  the thoracic spine. A curvilinear area of density projects within the right lung apex extending from the peritracheal region. This likely represents external artifact considering the appearance. Degenerative changes appreciated within the right and left shoulders.  IMPRESSION: 1. Interstitial infiltrate likely reflecting pulmonary edema. An infectious or inflammatory interstitial infiltrate cannot be excluded if clinically appropriate. 2. Curvilinear density right upper lobe likely artifact   2D Echo: 07/28/13 Study Conclusions  - Left ventricle: The cavity size was normal. Wall thickness was normal. Systolic function was normal. The estimated ejection fraction was in the range of 55% to 60%. There is hypokinesis of the basal inferior posterior myocardium. Doppler parameters are consistent with abnormal  left ventricular relaxation (grade 1 diastolic dysfunction). - Aortic valve: Trivial regurgitation. - Mitral valve: Calcified annulus. Mildly thickened leaflets . Mild regurgitation. - Right ventricle: The cavity size was mildly dilated. - Right atrium: The atrium was mildly dilated. - Pulmonary arteries: PA peak pressure: 32mm Hg (S). Impressions: - Since study of 12/18/10, wall motion abnormality is new and MR now appears mild.  Cardiac Catheterization Procedure Note: 07/27/13 Procedure: Left Heart Cath, Selective Coronary Angiography, LV angiography, PTCA and stenting of the RCA (distal)  PROCEDURAL FINDINGS  Hemodynamics:  AO 125/70  LV 124/9   Coronary angiography:  Coronary dominance: right  Left mainstem: Patent with 20-30% distal left main stenosis. The left main arises from the left cusp it divides into the LAD and left circumflex.  Left anterior descending (LAD): The LAD is moderately calcified. The vessel is diffusely diseased with moderate mid vessel stenosis. There is some intramyocardial bridging present. I would estimate the mid vessel stenosis at 50-60%. The proximal vessel has diffuse 40-50% stenosis. The first diagonal has 75-80% ostial stenosis.  Left circumflex (LCx): The circumflex is medium in caliber. The vessel gives off a single OM branch with no significant stenosis. The proximal circumflex has a dilated segment followed by 30-40% stenosis.  Right coronary artery (RCA): The right coronary artery is a large, dominant vessel. There are diffuse irregularities in the proximal vessel. There is mild calcification present. The distal RCA is totally occluded with contrast staining suggestive of acute occlusion. There is no collateral filling of the PDA or PLB.     **The RCA was successfully stented using a  3.5 x 20 mm Promus DES.**  Left ventriculography: Left ventricular systolic function is low normal. There is hypokinesis of the basal and midinferior walls. The other LV  wall segments are normal.    Discharge Medications     Medication List    STOP taking these medications       ADVIL 200 MG tablet  Generic drug:  ibuprofen      TAKE these medications       aspirin 81 MG chewable tablet  Chew 1 tablet (81 mg total) by mouth daily.     atorvastatin 20 MG tablet  Commonly known as:  LIPITOR  Take 1 tablet (20 mg total) by mouth daily at 6 PM.     cholecalciferol 400 UNITS Tabs tablet  Commonly known as:  VITAMIN D  Take 400 Units by mouth daily.     clobetasol cream 0.05 %  Commonly known as:  TEMOVATE  Apply 1 application topically daily as needed (psoriasis).     desonide 0.05 % cream  Commonly known as:  DESOWEN  Apply 1 application topically 2 (two) times daily as needed (rash).     fluocinonide cream 0.05 %  Commonly known as:  LIDEX  Apply 1 application topically 2 (two) times daily as needed (psoriasis).     loratadine 10 MG tablet  Commonly known as:  CLARITIN  Take 10 mg by mouth daily as needed for allergies.     multivitamin tablet  Take 1 tablet by mouth daily.     SYSTANE OP  Place 1 drop into both eyes daily as needed (dry eye).     Ticagrelor 90 MG Tabs tablet  Commonly known as:  BRILINTA  Take 1 tablet (90 mg total) by mouth 2 (two) times daily.     tiotropium 18 MCG inhalation capsule  Commonly known as:  SPIRIVA  Place 18 mcg into inhaler and inhale daily as needed (shortness of breath).        Disposition   The patient will be discharged in stable condition to home. Discharge Orders   Future Appointments Provider Department Dept Phone   08/12/2013 8:00 AM Rosalio Macadamia, NP [facility] [place] Office [phone]   12/16/2013 10:30 AM Corwin Levins, MD [facility] [phone]   01/27/2014 2:30 PM [facility] MEDICAL ONCOLOGY [phone]   01/27/2014 3:00 PM Benjiman Core, MD [facility] MEDICAL ONCOLOGY [phone]   Future  Orders Complete By Expires   Amb Referral to Cardiac Rehabilitation  As directed    Discharge patient  As directed      Follow-up Information   Follow up with Norma Fredrickson, NP On 08/12/2013. (8 am)    Specialty:  Nurse Practitioner   Contact information:   [address] [place] [address] [phone]         Duration of Discharge Encounter: Greater than 30 minutes including physician and PA time.  Johnny Bridge, Dory Demont PA-C 07/29/2013, 10:36 AM

## 2013-07-29 NOTE — Progress Notes (Signed)
    Subjective:  Feels good this am. No chest pain or dyspnea.   Objective:  Vital Signs in the last 24 hours: Temp:  [97.8 F (36.6 C)-99.8 F (37.7 C)] 98.5 F (36.9 C) (11/19 0447) Pulse Rate:  [52-62] 52 (11/19 0447) Resp:  [13-18] 18 (11/19 0447) BP: (98-124)/(46-58) 124/50 mmHg (11/19 0447) SpO2:  [95 %-98 %] 98 % (11/19 0447)  Intake/Output from previous day: 11/18 0701 - 11/19 0700 In: 240 [P.O.:240] Out: -   Physical Exam: Pt is alert and oriented, NAD HEENT: normal Neck: JVP - normal, carotids 2+= without bruits Lungs: prolonged expiratory phase CV: RRR without murmur or gallop Abd: soft, NT, Positive BS, no hepatomegaly Ext: no C/C/E, distal pulses intact and equal Skin: warm/dry no rash  Lab Results:  Recent Labs  07/27/13 1355 07/27/13 1402 07/28/13 0500  WBC 8.8  --  8.4  HGB 12.8* 15.0 12.1*  PLT 173  --  156    Recent Labs  07/27/13 2355 07/28/13 0500  NA 136 138  K 5.4* 4.2  CL 104 104  CO2 22 26  GLUCOSE 82 86  BUN 9 8  CREATININE 0.55 0.59    Recent Labs  07/27/13 1415  TROPONINI <0.30    Cardiac Studies: 2D Echo: Study Conclusions  - Left ventricle: The cavity size was normal. Wall thickness was normal. Systolic function was normal. The estimated ejection fraction was in the range of 55% to 60%. There is hypokinesis of the basal inferior posterior myocardium. Doppler parameters are consistent with abnormal left ventricular relaxation (grade 1 diastolic dysfunction). - Aortic valve: Trivial regurgitation. - Mitral valve: Calcified annulus. Mildly thickened leaflets . Mild regurgitation. - Right ventricle: The cavity size was mildly dilated. - Right atrium: The atrium was mildly dilated. - Pulmonary arteries: PA peak pressure: 32mm Hg (S). Impressions:  - Since study of 12/18/10, wall motion abnormality is new and MR now appears mild.  Tele: Sinus brady, personally reviewed.  Assessment/Plan:  1. Acute  inferolateral wall STEMI - s/p primary PCI 2. Tobacco abuse - longstanding  3. COPD, secondary to #2 4. Bradycardia  Pt with preserved LVEF and uncomplicated hospital course after his inferior MI. Stable for discharge today. BP is low and he has resting bradycardia. He will not tolerate a beta-blocker or ACE-I. Will d/c on ASA, brilinta which should be continued without interruption for at least 12 months. Also will treat with low-dose statin even though lipids are ideal on no therapy (acute MI secondary risk reduction). Follow-up APP in office 1-2 weeks.    Tonny Bollman, M.D. 07/29/2013, 6:58 AM

## 2013-08-03 ENCOUNTER — Telehealth: Payer: Self-pay | Admitting: Nurse Practitioner

## 2013-08-03 NOTE — Telephone Encounter (Signed)
Can someone call him to figure this out?

## 2013-08-03 NOTE — Telephone Encounter (Signed)
Are they aware that he will be seeing me? The appointment is not with Dr. Excell Seltzer.

## 2013-08-03 NOTE — Telephone Encounter (Signed)
Patient's son called with questions: 1) followup appt with cardiologist is Dec. 3rd - this was confirmed.  2) should they contact PCP regarding anything? - advised that they should notify PCP regarding recent cardiac event and hospitalization to see if pt needs to be seen in appointment sooner than scheduled appointment in April, 2015, and so that PCP is aware of all changes in status and medications, and 3) requesting information regarding last labs, esp INR. Provided lab results from 11/17 and 11/18.  Verbalized understanding and appreciation for information. States patient is doing very well and constipation is no longer an issue as this morning. Patient will be seeing Dr. Excell Seltzer in appointment on December 3rd at 0800.

## 2013-08-03 NOTE — Telephone Encounter (Signed)
New message  Patient has questions regarding PT INR lab test? Over the weekend he had trouble with constipation, please call son and advise.

## 2013-08-03 NOTE — Telephone Encounter (Signed)
I have never seen this patient - is he trying to talk to "Lauren"?

## 2013-08-03 NOTE — Telephone Encounter (Signed)
Yes, patient was notified that he will be seen by Norma Fredrickson, NP, at the 12/3 appointment. He verbalized understanding and appreciation.

## 2013-08-12 ENCOUNTER — Encounter: Payer: Self-pay | Admitting: Nurse Practitioner

## 2013-08-12 ENCOUNTER — Ambulatory Visit (INDEPENDENT_AMBULATORY_CARE_PROVIDER_SITE_OTHER): Payer: Medicare Other | Admitting: Nurse Practitioner

## 2013-08-12 VITALS — BP 150/64 | HR 47 | Ht 71.0 in | Wt 129.1 lb

## 2013-08-12 DIAGNOSIS — I2119 ST elevation (STEMI) myocardial infarction involving other coronary artery of inferior wall: Secondary | ICD-10-CM

## 2013-08-12 NOTE — Progress Notes (Signed)
Jon Hicks Date of Birth: 1927-10-11 Medical Record #[id number]  History of Present Illness: Jon Hicks is seen back today for a post hospital visit. He has COPD, GERD, psoriasis, and current tobacco abuse. He also sees Dr. 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 His M spike is about 1 g/dL, IgG level slightly elevated at 1860. No clear cut end organ damage likely - MGUS diagnosed in 07/2011.  Most recently presented with acute inferolateral wall ST segment elevation. No prior history of CAD, HTN or HLD. Was taken emergently to the cath lab and had DES x 1 to the distal RCA. Also noted to have moderate LAD stenosis, mild LCX stenosis and mild segmental LV dysfunction but with preserved LVEF. Echo showed EF of 55 to 60%. Post op course was uncomplicated. No ACE or beta blocker due to soft BP and resting bradycardia. Discharged on ASA and brilinta for at least 12 months. Statin was started.  Comes back today. Here with his son. Doing ok and says he is doing well. He apparently has had an abnormal CXR that has been felt to cause some left sided chest pain - this has been going on for years - he continues to have this - he has had No pain like what took him to the hospital. Stable dyspnea with his COPD. Walking some. Back driving. Little headache at times. Not bruising. Overall, he feels like he is doing ok. Does have some constipation and wants to know what to use. Has not smoked any since discharge but admits that he will probably continue to smoke a few cigars.   Current Outpatient Prescriptions  Medication Sig Dispense Refill  . aspirin 81 MG chewable tablet Chew 1 tablet (81 mg total) by mouth daily.      Marland Kitchen atorvastatin (LIPITOR) 20 MG tablet Take 1 tablet (20 mg total) by mouth daily at 6 PM.  30 tablet  6  . cholecalciferol (VITAMIN D) 400 UNITS TABS tablet Take 400 Units by mouth daily.      . clobetasol (TEMOVATE) 0.05 % cream Apply 1 application  topically daily as needed (psoriasis).       Marland Kitchen desonide (DESOWEN) 0.05 % cream Apply 1 application topically 2 (two) times daily as needed (rash).       Marland Kitchen docusate sodium (COLACE) 100 MG capsule Take 100 mg by mouth daily as needed for mild constipation.      . fluocinonide cream (LIDEX) 0.05 % Apply 1 application topically 2 (two) times daily as needed (psoriasis).      Marland Kitchen loratadine (CLARITIN) 10 MG tablet Take 10 mg by mouth daily as needed for allergies.       . Multiple Vitamin (MULTIVITAMIN) tablet Take 1 tablet by mouth daily.      . nitroGLYCERIN (NITROSTAT) 0.4 MG SL tablet Place 1 tablet (0.4 mg total) under the tongue every 5 (five) minutes as needed for chest pain.  25 tablet  3  . Polyethyl Glycol-Propyl Glycol (SYSTANE OP) Place 1 drop into both eyes daily as needed (dry eye).      . psyllium (METAMUCIL) 58.6 % packet Take 1 packet by mouth as needed.      . Ticagrelor (BRILINTA) 90 MG TABS tablet Take 1 tablet (90 mg total) by mouth 2 (two) times daily.  60 tablet  11  . tiotropium (SPIRIVA) 18 MCG inhalation capsule Place 18 mcg into inhaler and inhale daily as needed (shortness of breath).  No current facility-administered medications for this visit.    No Known Allergies  Past Medical History  Diagnosis Date  . DJD (degenerative joint disease)     (no psoriatic arthritis per pt.); hx of left knee cortisone at [facility]  . Prostatitis     hx  . Allergic rhinitis   . ALLERGIC RHINITIS 11/22/2009  . COPD 11/22/2009  . GERD 11/22/2009  . PSORIASIS 11/22/2009  . Vitamin D deficiency 12/01/2010  . CAD (coronary artery disease)     a. s/p DES distal RCA, 20-30% sten in dist L main,  50-60% sten LAD,  40-50% sten prox vessel, 75-80% sten D1, 30-40% sten LCx, EF 55-60%  . Tobacco abuse     Past Surgical History  Procedure Laterality Date  . Cholecystectomy    . Edg      Dr. Rhea Belton esophageal nodule-normal tissue, 2000  . Cardiolite      normal exercise  cardiolite 1999  . Urtheral      s/p urtheral stricture dilation-per urology-Dr. Alto [place] 1984  . Herniorrhapy      inguinal herniorrhapy-bilat    History  Smoking status  . Current Every Day Smoker  . Types: Cigars  Smokeless tobacco  . Never Used    History  Alcohol Use No    Family History  Problem Relation Age of Onset  . Arthritis Mother   . Heart disease Father     MI    Review of Systems: The review of systems is per HPI.  All other systems were reviewed and are negative.  Physical Exam: BP 150/64  Pulse 47  Ht 5\' 11"  (1.803 m)  Wt 129 lb 1.9 oz (58.568 kg)  BMI 18.02 kg/m2  SpO2 99% BP by me is 122/60.  Patient is very pleasant and in no acute distress. Skin is warm and dry. Color is normal.  HEENT is unremarkable. Normocephalic/atraumatic. PERRL. Sclera are nonicteric. Neck is supple. No masses. No JVD. Lungs are clear. Cardiac exam shows a regular rate and rhythm. Heart rate is slow. Abdomen is soft. Extremities are without edema. Gait and ROM are intact. No gross neurologic deficits noted.  LABORATORY DATA: BMET and CBC pending   Lab Results  Component Value Date   WBC 8.4 07/28/2013   HGB 12.1* 07/28/2013   HCT 34.9* 07/28/2013   PLT 156 07/28/2013   GLUCOSE 86 07/28/2013   CHOL 141 07/27/2013   TRIG 64 07/27/2013   HDL 63 07/27/2013   LDLCALC 65 07/27/2013   ALT 16 07/27/2013   AST 22 07/27/2013   NA 138 07/28/2013   K 4.2 07/28/2013   CL 104 07/28/2013   CREATININE 0.59 07/28/2013   BUN 8 07/28/2013   CO2 26 07/28/2013   TSH 0.86 06/17/2013   PSA 1.53 11/22/2009   INR 2.02* 07/27/2013   HGBA1C 5.8* 07/27/2013    Lab Results  Component Value Date   CKTOTAL 871* 07/28/2013   CKMB 99.8* 07/28/2013   TROPONINI <0.30 07/27/2013    Coronary angiography:  Coronary dominance: right  Left mainstem: Patent with 20-30% distal left main stenosis. The left main arises from the left cusp it divides into the LAD and left circumflex.  Left anterior  descending (LAD): The LAD is moderately calcified. The vessel is diffusely diseased with moderate mid vessel stenosis. There is some intramyocardial bridging present. I would estimate the mid vessel stenosis at 50-60%. The proximal vessel has diffuse 40-50% stenosis. The first diagonal has 75-80% ostial stenosis.  Left  circumflex (LCx): The circumflex is medium in caliber. The vessel gives off a single OM branch with no significant stenosis. The proximal circumflex has a dilated segment followed by 30-40% stenosis.  Right coronary artery (RCA): The right coronary artery is a large, dominant vessel. There are diffuse irregularities in the proximal vessel. There is mild calcification present. The distal RCA is totally occluded with contrast staining suggestive of acute occlusion. There is no collateral filling of the PDA or PLB.  Left ventriculography: Left ventricular systolic function is low normal. There is hypokinesis of the basal and midinferior walls. The other LV wall segments are normal.  PCI Note: Following the diagnostic procedure, the decision was made to proceed with PCI. Weight-based bivalirudin was given for anticoagulation. Once a therapeutic ACT was achieved, a 6 [place] JR 4 guide catheter was inserted. A cougar coronary guidewire was used to cross the lesion. The lesion was predilated with a 2.5 x 15 mm balloon. The lesion was then stented with a 3.5 x 20 mm Promus premier drug-eluting stent. The stent was postdilated with a 3.75 mm noncompliant balloon. Following PCI, there was 0% residual stenosis and TIMI-3 flow. Final angiography confirmed an excellent result. The patient tolerated the procedure well. There were no immediate procedural complications. A TR band was used for radial hemostasis. The patient was transferred to the post catheterization recovery area for further monitoring.  PCI Data:  Vessel - RCA/Segment - distal  Percent Stenosis (pre) 100  TIMI-flow 0  Stent 3.5 x 20 mm  Promus DES  Percent Stenosis (post) 0  TIMI-flow (post) 3   Final Conclusions:  1. Acute inferolateral myocardial infarction secondary to acute occlusion of the distal RCA, treated successfully with primary PCI using a drug-eluting stent  2. Moderate LAD stenosis  3. Mild left circumflex stenosis  4. Mild segmental left ventricular dysfunction with preserved overall LVEF   Recommendations:  The patient will require dual antiplatelet therapy with aspirin and brilinta for at least one year. He did not have good ST segment resolution at the completion of the procedure. I suspect this is related to distal embolization as the distal most portions of the PLA branches have that appearance. Despite his residual ST elevation, his chest pain is markedly improved. Will continue bivalirudin for 2 hours.  Jon Hicks  07/27/2013, 2:54 PM  Echo Study Conclusions  - Left ventricle: The cavity size was normal. Wall thickness was normal. Systolic function was normal. The estimated ejection fraction was in the range of 55% to 60%. There is hypokinesis of the basal inferior posterior myocardium. Doppler parameters are consistent with abnormal left ventricular relaxation (grade 1 diastolic dysfunction). - Aortic valve: Trivial regurgitation. - Mitral valve: Calcified annulus. Mildly thickened leaflets . Mild regurgitation. - Right ventricle: The cavity size was mildly dilated. - Right atrium: The atrium was mildly dilated. - Pulmonary arteries: PA peak pressure: 32mm Hg (S).  Assessment / Plan:  1. Inferolateral MI treated with DES to the distal RCA - has residual nonobstructive CAD - to manage medically - needs follow up BMET/CBC today - will have to send downstairs for his labs. Ok to start cardiac rehab. Will see him back in a month.   2. HLD - now on statin - recheck lipids in 4 weeks   3. Tobacco abuse - continued cessation is encouraged   4. Resting bradycardia - no passing out spells  reported.   See him back in a month with fasting labs. Activity is advanced. Encouraged him  to use Miralax for his constipation.  Patient is agreeable to this plan and will call if any problems develop in the interim.   Jon Macadamia, RN, ANP-C [facility] [address] [place], [place]  [address]

## 2013-08-12 NOTE — Patient Instructions (Addendum)
Stay on your current medicines  Need fasting labs in one month  Ok to use Miralax for constipation  Gradually increase your activities in the next 2 weeks  Ok to start cardiac rehab - I have sent them a note that you can start  See me in a month  We will check labs today  Call the [facility] office at [phone] if you have any questions, problems or concerns.

## 2013-08-14 ENCOUNTER — Ambulatory Visit (INDEPENDENT_AMBULATORY_CARE_PROVIDER_SITE_OTHER): Payer: Medicare Other | Admitting: Internal Medicine

## 2013-08-14 ENCOUNTER — Encounter: Payer: Self-pay | Admitting: Internal Medicine

## 2013-08-14 VITALS — BP 140/64 | HR 51 | Temp 97.0°F | Ht 71.0 in | Wt 133.2 lb

## 2013-08-14 DIAGNOSIS — F17200 Nicotine dependence, unspecified, uncomplicated: Secondary | ICD-10-CM

## 2013-08-14 DIAGNOSIS — K5900 Constipation, unspecified: Secondary | ICD-10-CM | POA: Insufficient documentation

## 2013-08-14 DIAGNOSIS — J449 Chronic obstructive pulmonary disease, unspecified: Secondary | ICD-10-CM

## 2013-08-14 DIAGNOSIS — Z720 Tobacco use: Secondary | ICD-10-CM

## 2013-08-14 DIAGNOSIS — J4489 Other specified chronic obstructive pulmonary disease: Secondary | ICD-10-CM

## 2013-08-14 DIAGNOSIS — K219 Gastro-esophageal reflux disease without esophagitis: Secondary | ICD-10-CM

## 2013-08-14 NOTE — Assessment & Plan Note (Signed)
Declines PPI, for pepcid AC otc per pt preference

## 2013-08-14 NOTE — Assessment & Plan Note (Signed)
D/w pt - ok to cont otc laxatvie prn,  to f/u any worsening symptoms or concerns

## 2013-08-14 NOTE — Progress Notes (Signed)
Subjective:    Patient ID: Jon Hicks, male    DOB: Nov 18, 1927, 79 y.o.   MRN: [id number]  HPI Here to f/u; overall doing ok,  Pt denies chest pain, increased sob or doe, wheezing, orthopnea, PND, increased LE swelling, palpitations, dizziness or syncope.  Pt denies polydipsia, polyuria,.  Pt denies new neurological symptoms such as new headache, or facial or extremity weakness or numbness.   Pt states overall good compliance with meds, has been trying to follow lower cholesterol diet, with wt overall stable,  but little exercise however. Has had mild worsening reflux, but no abd pain, dysphagia, n/v, bowel change or blood, except for occas constipation, better with otc laxatives.  Still smoking - declines to quit. Past Medical History  Diagnosis Date  . DJD (degenerative joint disease)     (no psoriatic arthritis per pt.); hx of left knee cortisone at [facility]  . Prostatitis     hx  . Allergic rhinitis   . ALLERGIC RHINITIS 11/22/2009  . COPD 11/22/2009  . GERD 11/22/2009  . PSORIASIS 11/22/2009  . Vitamin D deficiency 12/01/2010  . CAD (coronary artery disease)     a. s/p DES distal RCA, 20-30% sten in dist L main,  50-60% sten LAD,  40-50% sten prox vessel, 75-80% sten D1, 30-40% sten LCx, EF 55-60%  . Tobacco abuse    Past Surgical History  Procedure Laterality Date  . Cholecystectomy    . Edg      Dr. Rhea Belton esophageal nodule-normal tissue, 2000  . Cardiolite      normal exercise cardiolite 1999  . Urtheral      s/p urtheral stricture dilation-per urology-Dr. Alto [place] 1984  . Herniorrhapy      inguinal herniorrhapy-bilat    reports that he has been smoking Cigars.  He has never used smokeless tobacco. He reports that he does not drink alcohol or use illicit drugs. family history includes Arthritis in his mother; Heart disease in his father. No Known Allergies Current Outpatient Prescriptions on File Prior to Visit  Medication Sig Dispense Refill  . aspirin 81 MG  chewable tablet Chew 1 tablet (81 mg total) by mouth daily.      Marland Kitchen atorvastatin (LIPITOR) 20 MG tablet Take 1 tablet (20 mg total) by mouth daily at 6 PM.  30 tablet  6  . cholecalciferol (VITAMIN D) 400 UNITS TABS tablet Take 400 Units by mouth daily.      . clobetasol (TEMOVATE) 0.05 % cream Apply 1 application topically daily as needed (psoriasis).       Marland Kitchen desonide (DESOWEN) 0.05 % cream Apply 1 application topically 2 (two) times daily as needed (rash).       Marland Kitchen docusate sodium (COLACE) 100 MG capsule Take 100 mg by mouth daily as needed for mild constipation.      . fluocinonide cream (LIDEX) 0.05 % Apply 1 application topically 2 (two) times daily as needed (psoriasis).      Marland Kitchen loratadine (CLARITIN) 10 MG tablet Take 10 mg by mouth daily as needed for allergies.       . Multiple Vitamin (MULTIVITAMIN) tablet Take 1 tablet by mouth daily.      . nitroGLYCERIN (NITROSTAT) 0.4 MG SL tablet Place 1 tablet (0.4 mg total) under the tongue every 5 (five) minutes as needed for chest pain.  25 tablet  3  . Polyethyl Glycol-Propyl Glycol (SYSTANE OP) Place 1 drop into both eyes daily as needed (dry eye).      Marland Kitchen  psyllium (METAMUCIL) 58.6 % packet Take 1 packet by mouth as needed.      . Ticagrelor (BRILINTA) 90 MG TABS tablet Take 1 tablet (90 mg total) by mouth 2 (two) times daily.  60 tablet  11  . tiotropium (SPIRIVA) 18 MCG inhalation capsule Place 18 mcg into inhaler and inhale daily as needed (shortness of breath).       No current facility-administered medications on file prior to visit.   Review of Systems  Constitutional: Negative for unexpected weight change, or unusual diaphoresis  HENT: Negative for tinnitus.   Eyes: Negative for photophobia and visual disturbance.  Respiratory: Negative for choking and stridor.   Gastrointestinal: Negative for vomiting and blood in stool.  Genitourinary: Negative for hematuria and decreased urine volume.  Musculoskeletal: Negative for acute joint  swelling Skin: Negative for color change and wound.  Neurological: Negative for tremors and numbness other than noted  Psychiatric/Behavioral: Negative for decreased concentration or  hyperactivity.       Objective:   Physical Exam BP 140/64  Pulse 51  Temp(Src) 97 F (36.1 C) (Oral)  Ht 5\' 11"  (1.803 m)  Wt 133 lb 4 oz (60.442 kg)  BMI 18.59 kg/m2  SpO2 99% VS noted,  Constitutional: Pt appears well-developed and well-nourished.  HENT: Head: NCAT.  Right Ear: External ear normal.  Left Ear: External ear normal.  Eyes: Conjunctivae and EOM are normal. Pupils are equal, round, and reactive to light.  Neck: Normal range of motion. Neck supple.  Cardiovascular: Normal rate and regular rhythm.   Pulmonary/Chest: Effort normal and breath sounds decreased, no rales or wheezes.  Abd:  Soft, NT, non-distended, + BS Neurological: Pt is alert. Not confused  Skin: Skin is warm. No erythema.  Psychiatric: Pt behavior is normal. Thought content normal.     Assessment & Plan:

## 2013-08-14 NOTE — Assessment & Plan Note (Signed)
stable overall by history and exam, recent data reviewed with pt, and pt to continue medical treatment as before,  to f/u any worsening symptoms or concerns SpO2 Readings from Last 3 Encounters:  08/14/13 99%  08/12/13 99%  07/29/13 98%

## 2013-08-14 NOTE — Progress Notes (Signed)
Pre-visit discussion using our clinic review tool. No additional management support is needed unless otherwise documented below in the visit note.  

## 2013-08-14 NOTE — Patient Instructions (Addendum)
Please continue all other medications as before, and refills have been done if requested. Please have the pharmacy call with any other refills you may need.  Please keep your appointments with your specialists as you have planned  Please continue your efforts at being more active, low cholesterol diet, and weight control.  Ok to cancel the April 2015 appt  Please return in 6 months, or sooner if needed

## 2013-08-14 NOTE — Assessment & Plan Note (Signed)
Counseled to quit 

## 2013-08-20 ENCOUNTER — Encounter (HOSPITAL_COMMUNITY)
Admission: RE | Admit: 2013-08-20 | Discharge: 2013-08-20 | Disposition: A | Discharge status: Home / Self Care | Payer: Medicare Other | Source: Ambulatory Visit | Attending: Cardiovascular Disease | Admitting: Cardiovascular Disease

## 2013-08-20 DIAGNOSIS — I2510 Atherosclerotic heart disease of native coronary artery without angina pectoris: Secondary | ICD-10-CM | POA: Insufficient documentation

## 2013-08-20 DIAGNOSIS — J449 Chronic obstructive pulmonary disease, unspecified: Secondary | ICD-10-CM | POA: Insufficient documentation

## 2013-08-20 DIAGNOSIS — I498 Other specified cardiac arrhythmias: Secondary | ICD-10-CM | POA: Insufficient documentation

## 2013-08-20 DIAGNOSIS — Z8249 Family history of ischemic heart disease and other diseases of the circulatory system: Secondary | ICD-10-CM | POA: Insufficient documentation

## 2013-08-20 DIAGNOSIS — I472 Ventricular tachycardia, unspecified: Secondary | ICD-10-CM | POA: Insufficient documentation

## 2013-08-20 DIAGNOSIS — I2119 ST elevation (STEMI) myocardial infarction involving other coronary artery of inferior wall: Secondary | ICD-10-CM | POA: Insufficient documentation

## 2013-08-20 DIAGNOSIS — J4489 Other specified chronic obstructive pulmonary disease: Secondary | ICD-10-CM | POA: Insufficient documentation

## 2013-08-20 DIAGNOSIS — Z79899 Other long term (current) drug therapy: Secondary | ICD-10-CM | POA: Insufficient documentation

## 2013-08-20 DIAGNOSIS — K219 Gastro-esophageal reflux disease without esophagitis: Secondary | ICD-10-CM | POA: Insufficient documentation

## 2013-08-20 DIAGNOSIS — I519 Heart disease, unspecified: Secondary | ICD-10-CM | POA: Insufficient documentation

## 2013-08-20 DIAGNOSIS — Z5189 Encounter for other specified aftercare: Secondary | ICD-10-CM | POA: Insufficient documentation

## 2013-08-20 DIAGNOSIS — I4729 Other ventricular tachycardia: Secondary | ICD-10-CM | POA: Insufficient documentation

## 2013-08-20 DIAGNOSIS — F17200 Nicotine dependence, unspecified, uncomplicated: Secondary | ICD-10-CM | POA: Insufficient documentation

## 2013-08-20 NOTE — Progress Notes (Signed)
Cardiac Rehab Medication Review by a Pharmacist  Does the patient  feel that his/her medications are working for him/her?  yes  Has the patient been experiencing any side effects to the medications prescribed?  no  Does the patient measure his/her own blood pressure or blood glucose at home?  sometimes   Does the patient have any problems obtaining medications due to transportation or finances?   no  Understanding of regimen: good Understanding of indications: good Potential of compliance: good    Pharmacist comments: patient states wife helps him with his medications at home.    Jon Hicks 08/20/2013 8:21 AM

## 2013-08-24 ENCOUNTER — Encounter (HOSPITAL_COMMUNITY)
Admission: RE | Admit: 2013-08-24 | Discharge: 2013-08-24 | Disposition: A | Discharge status: Home / Self Care | Payer: Medicare Other | Source: Ambulatory Visit | Attending: Cardiovascular Disease | Admitting: Cardiovascular Disease

## 2013-08-24 NOTE — Progress Notes (Signed)
Pt started cardiac rehab today.  Pt tolerated light exercise without difficulty. Telemetry rhythm sinus without ectopy. Vital signs stable. Will continue to monitor the patient throughout  the program. I reviewed Fred's quality of life questionnaire with him. Will continue to monitor the patient throughout  the program.

## 2013-08-25 ENCOUNTER — Other Ambulatory Visit: Payer: Self-pay | Admitting: *Deleted

## 2013-08-25 MED ORDER — ATORVASTATIN CALCIUM 20 MG PO TABS: 20.0000 mg | ORAL_TABLET | Freq: Every day | ORAL | Status: DC

## 2013-08-26 ENCOUNTER — Encounter (HOSPITAL_COMMUNITY)
Admission: RE | Admit: 2013-08-26 | Discharge: 2013-08-26 | Disposition: A | Discharge status: Home / Self Care | Payer: Medicare Other | Source: Ambulatory Visit | Attending: Cardiovascular Disease | Admitting: Cardiovascular Disease

## 2013-08-28 ENCOUNTER — Encounter (HOSPITAL_COMMUNITY)
Admission: RE | Admit: 2013-08-28 | Discharge: 2013-08-28 | Disposition: A | Discharge status: Home / Self Care | Payer: Medicare Other | Source: Ambulatory Visit | Attending: Cardiovascular Disease | Admitting: Cardiovascular Disease

## 2013-08-31 ENCOUNTER — Encounter (HOSPITAL_COMMUNITY)
Admission: RE | Admit: 2013-08-31 | Discharge: 2013-08-31 | Disposition: A | Discharge status: Home / Self Care | Payer: Medicare Other | Source: Ambulatory Visit | Attending: Cardiovascular Disease | Admitting: Cardiovascular Disease

## 2013-09-02 ENCOUNTER — Encounter (HOSPITAL_COMMUNITY)
Admission: RE | Admit: 2013-09-02 | Discharge: 2013-09-02 | Disposition: A | Discharge status: Home / Self Care | Payer: Medicare Other | Source: Ambulatory Visit | Attending: Cardiovascular Disease | Admitting: Cardiovascular Disease

## 2013-09-07 ENCOUNTER — Encounter (HOSPITAL_COMMUNITY)
Admission: RE | Admit: 2013-09-07 | Discharge: 2013-09-07 | Disposition: A | Discharge status: Home / Self Care | Payer: Medicare Other | Source: Ambulatory Visit | Attending: Cardiovascular Disease | Admitting: Cardiovascular Disease

## 2013-09-07 NOTE — Progress Notes (Signed)
Jon Hicks 79 y.o. male Nutrition Note Spoke with pt.  Nutrition Plan and Nutrition Survey goals reviewed with pt. Pt is following Step 1 of the Therapeutic Lifestyle Changes diet. Per discussion with pt, pt has weighed "around 130 lb" for "a while." Pt states he used to weigh "in the 150's about 5 years ago." Pt encouraged by this writer to consider adding supplements given BMI < 21 and dx of COPD. Pt does not feel like he needs a supplement because "I eat pretty good." Pt snacks on cookies and candy. Pt encouraged to substitute a healthier, high protein snack between meals. According to pt's last A1c, pt is pre-diabetic. Pre-diabetes discussed. Pt reports he continues smoking cigars "about 3 a day, which is down from 7." Pt taking a MVI. Pt expressed understanding of the information reviewed. Pt aware of nutrition education classes offered and plans on attending nutrition classes.  Nutrition Diagnosis   Food-and nutrition-related knowledge deficit related to lack of exposure to information as related to diagnosis of: ? CVD ? Pre-DM (A1c 5.8) ?  Nutrition RX/ Estimated Daily Nutrition Needs for: wt maintenance 1900-2200 Kcal, 60-70 gm fat, 12-15 gm sat fat, 1.9-2.2 gm trans-fat, <1500 mg sodium   Nutrition Intervention   Pt's individual nutrition plan including cholesterol goals reviewed with pt.   Benefits of adopting Therapeutic Lifestyle Changes discussed when Medficts reviewed.   Pt to attend the Portion Distortion class   Pt to attend the  ? Nutrition I class                     ? Nutrition II class   Pt to consider adding [employer], Boost or Ensure once daily.   Continue client-centered nutrition education by RD, as part of interdisciplinary care.  Goal(s)   Pt to describe the benefit of including fruits, vegetables, and whole grains in a heart healthy meal plan.  Monitor and Evaluate progress toward nutrition goal with team. Nutrition Risk:  Low   [name], M.Ed, RD, LDN, CDE 09/07/2013 10:26 AM

## 2013-09-09 ENCOUNTER — Encounter (HOSPITAL_COMMUNITY)
Admission: RE | Admit: 2013-09-09 | Discharge: 2013-09-09 | Disposition: A | Discharge status: Home / Self Care | Payer: Medicare Other | Source: Ambulatory Visit | Attending: Cardiovascular Disease | Admitting: Cardiovascular Disease

## 2013-09-09 NOTE — Progress Notes (Signed)
Reviewed home exercise with pt today.  Pt plans to walk at home for exercise.  Reviewed THR, pulse, RPE, sign and symptoms, NTG use, and when to call 911 or MD.  Pt voiced understanding. Mattisyn Cardona, MA, ACSM RCEP   

## 2013-09-11 ENCOUNTER — Encounter (HOSPITAL_COMMUNITY): Payer: Medicare Other

## 2013-09-11 ENCOUNTER — Other Ambulatory Visit: Payer: Self-pay | Admitting: Dermatology

## 2013-09-14 ENCOUNTER — Encounter (HOSPITAL_COMMUNITY)
Admission: RE | Admit: 2013-09-14 | Discharge: 2013-09-14 | Disposition: A | Discharge status: Home / Self Care | Payer: Medicare Other | Source: Ambulatory Visit | Attending: Cardiovascular Disease | Admitting: Cardiovascular Disease

## 2013-09-14 DIAGNOSIS — I498 Other specified cardiac arrhythmias: Secondary | ICD-10-CM | POA: Insufficient documentation

## 2013-09-14 DIAGNOSIS — I472 Ventricular tachycardia, unspecified: Secondary | ICD-10-CM | POA: Insufficient documentation

## 2013-09-14 DIAGNOSIS — J4489 Other specified chronic obstructive pulmonary disease: Secondary | ICD-10-CM | POA: Insufficient documentation

## 2013-09-14 DIAGNOSIS — I2510 Atherosclerotic heart disease of native coronary artery without angina pectoris: Secondary | ICD-10-CM | POA: Insufficient documentation

## 2013-09-14 DIAGNOSIS — J449 Chronic obstructive pulmonary disease, unspecified: Secondary | ICD-10-CM | POA: Insufficient documentation

## 2013-09-14 DIAGNOSIS — Z79899 Other long term (current) drug therapy: Secondary | ICD-10-CM | POA: Insufficient documentation

## 2013-09-14 DIAGNOSIS — I2119 ST elevation (STEMI) myocardial infarction involving other coronary artery of inferior wall: Secondary | ICD-10-CM | POA: Insufficient documentation

## 2013-09-14 DIAGNOSIS — K219 Gastro-esophageal reflux disease without esophagitis: Secondary | ICD-10-CM | POA: Insufficient documentation

## 2013-09-14 DIAGNOSIS — Z5189 Encounter for other specified aftercare: Secondary | ICD-10-CM | POA: Insufficient documentation

## 2013-09-14 DIAGNOSIS — F17200 Nicotine dependence, unspecified, uncomplicated: Secondary | ICD-10-CM | POA: Insufficient documentation

## 2013-09-14 DIAGNOSIS — Z8249 Family history of ischemic heart disease and other diseases of the circulatory system: Secondary | ICD-10-CM | POA: Insufficient documentation

## 2013-09-14 DIAGNOSIS — I519 Heart disease, unspecified: Secondary | ICD-10-CM | POA: Insufficient documentation

## 2013-09-14 DIAGNOSIS — I4729 Other ventricular tachycardia: Secondary | ICD-10-CM | POA: Insufficient documentation

## 2013-09-15 ENCOUNTER — Encounter: Payer: Self-pay | Admitting: Nurse Practitioner

## 2013-09-15 ENCOUNTER — Ambulatory Visit (INDEPENDENT_AMBULATORY_CARE_PROVIDER_SITE_OTHER): Payer: Medicare Other | Admitting: Nurse Practitioner

## 2013-09-15 VITALS — BP 120/64 | HR 60 | Ht 71.0 in | Wt 132.4 lb

## 2013-09-15 DIAGNOSIS — E785 Hyperlipidemia, unspecified: Secondary | ICD-10-CM

## 2013-09-15 DIAGNOSIS — I2119 ST elevation (STEMI) myocardial infarction involving other coronary artery of inferior wall: Secondary | ICD-10-CM

## 2013-09-15 LAB — BASIC METABOLIC PANEL
BUN: 12 mg/dL (ref 6–23)
CO2: 29 mEq/L (ref 19–32)
Calcium: 8.9 mg/dL (ref 8.4–10.5)
Chloride: 104 mEq/L (ref 96–112)
Creatinine, Ser: 0.8 mg/dL (ref 0.4–1.5)
GFR: 97.94 mL/min (ref >60.00)
Glucose, Bld: 113 mg/dL — ABNORMAL HIGH (ref 70–99)
Potassium: 4.1 mEq/L (ref 3.5–5.1)
Sodium: 139 mEq/L (ref 135–145)

## 2013-09-15 LAB — HEPATIC FUNCTION PANEL
ALT: 21 U/L (ref 0–53)
AST: 24 U/L (ref 0–37)
Albumin: 3.6 g/dL (ref 3.5–5.2)
Alkaline Phosphatase: 101 U/L (ref 39–117)
Bilirubin, Direct: 0.1 mg/dL (ref 0.0–0.3)
Total Bilirubin: 0.7 mg/dL (ref 0.3–1.2)
Total Protein: 7 g/dL (ref 6.0–8.3)

## 2013-09-15 LAB — LIPID PANEL
Cholesterol: 109 mg/dL (ref 0–200)
HDL: 59.5 mg/dL (ref >39.00)
LDL Cholesterol: 35 mg/dL (ref 0–99)
Total CHOL/HDL Ratio: 2
Triglycerides: 73 mg/dL (ref 0.0–149.0)
VLDL: 14.6 mg/dL (ref 0.0–40.0)

## 2013-09-15 NOTE — Progress Notes (Signed)
Jon Hicks Date of Birth: 1928/03/22 Medical Record #[id number]  History of Present Illness: Mr. Busch is seen back today for a one month check. Seen for Dr. Burt Knack. He has COPD, GERD, psoriasis, and current tobacco abuse. He also sees Dr. 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 His M spike is about 1 g/dL, IgG level slightly elevated at 1860. No clear cut end organ damage likely - MGUS diagnosed in 07/2011.   Most recently presented with acute inferolateral wall ST segment elevation. No prior history of CAD, HTN or HLD. Was taken emergently to the cath lab and had DES x 1 to the distal RCA. Also noted to have moderate LAD stenosis, mild LCX stenosis and mild segmental LV dysfunction but with preserved LVEF. Echo showed EF of 55 to 60%. Post op course was uncomplicated. No ACE or beta blocker due to soft BP and resting bradycardia. Discharged on ASA and brilinta for at least 12 months. Statin was started.   Seen a month ago and felt to be doing ok. Referred to cardiac rehab.   Comes back today. Here alone. Doing ok. Smoking about 3 cigars a day - less than what he has in the past. Going to cardiac rehab. Wanting to resume some of his activities at home i.e., sweeping, vacuuming, etc. Wants to go back to the gym to do some light weights. Still has some left sided chest pain - the pain that he has had for many years and felt to be the result of an abnormal CXR in the past - NO symptoms similar to his pain with his MI. No exertional symptoms. Still with some constipation. Overall, he feels like he is doing well. Not dizzy or lightheaded and no passing out spells.   Current Outpatient Prescriptions  Medication Sig Dispense Refill  . aspirin 81 MG chewable tablet Chew 1 tablet (81 mg total) by mouth daily.      Marland Kitchen atorvastatin (LIPITOR) 20 MG tablet Take 1 tablet (20 mg total) by mouth daily at 6 PM.  90 tablet  0  . cholecalciferol (VITAMIN D) 400 UNITS TABS  tablet Take 2,000 Units by mouth daily.       . clobetasol (TEMOVATE) 0.05 % cream Apply 1 application topically daily as needed (psoriasis).       Marland Kitchen desonide (DESOWEN) 0.05 % cream Apply 1 application topically 2 (two) times daily as needed (rash).       Marland Kitchen docusate sodium (COLACE) 100 MG capsule Take 100 mg by mouth daily as needed for mild constipation.      . fluocinonide cream (LIDEX) 9.38 % Apply 1 application topically 2 (two) times daily as needed (psoriasis).      Marland Kitchen loratadine (CLARITIN) 10 MG tablet Take 10 mg by mouth daily as needed for allergies.       . Multiple Vitamin (MULTIVITAMIN) tablet Take 1 tablet by mouth daily.      . nitroGLYCERIN (NITROSTAT) 0.4 MG SL tablet Place 1 tablet (0.4 mg total) under the tongue every 5 (five) minutes as needed for chest pain.  25 tablet  3  . Polyethyl Glycol-Propyl Glycol (SYSTANE OP) Place 1 drop into both eyes daily as needed (dry eye).      . Ticagrelor (BRILINTA) 90 MG TABS tablet Take 1 tablet (90 mg total) by mouth 2 (two) times daily.  60 tablet  11  . tiotropium (SPIRIVA) 18 MCG inhalation capsule Place 18 mcg into inhaler and  inhale daily as needed (shortness of breath).       No current facility-administered medications for this visit.    No Known Allergies  Past Medical History  Diagnosis Date  . DJD (degenerative joint disease)     (no psoriatic arthritis per pt.); h[facility] of left knee cort[facility] at [facility]  . Prostatitis     hx  . Allergic rhinitis   . ALLERGIC RHINITIS 11/22/2009  . COPD 11/22/2009  . GERD 11/22/2009  . PSORIASIS 11/22/2009  . Vitamin D deficiency 12/01/2010  . CAD (coronary artery disease)     a. s/p DES distal RCA, 20-30% sten in dist L main,  50-60% sten LAD,  40-50% sten prox vessel, 75-80% sten D1, 30-40% sten LCx, EF 55-60%  . Tobacco abuse     Past Surgical History  Procedure Laterality Date  . Cholecystectomy    . Edg      Dr. Miguel Rota esophageal nodule-normal tissue, 2000  . Cardiolite        normal exercise cardiolite 1999  . Urtheral      s/p urtheral stricture dilation-per urology-Dr. Hunt 1984  . Herniorrhapy      inguinal herniorrhapy-bilat    History  Smoking status  . Current Every Day Smoker  . Types: Cigars  Smokeless tobacco  . Never Used    History  Alcohol Use No    Family History  Problem Relation Age of Onset  . Arthritis Mother   . Heart disease Father     MI    Review of Systems: The review of systems is per the HPI.  All other systems were reviewed and are negative.  Physical Exam: BP 120/64  Pulse 60  Ht 5\' 11"  (1.803 m)  Wt 132 lb 6.4 oz (60.056 kg)  BMI 18.47 kg/m2  SpO2 100% Patient is very pleasant and in no acute distress. Skin is warm and dry. Color is normal.  HEENT is unremarkable. Normocephalic/atraumatic. PERRL. Sclera are nonicteric. Neck is supple. No masses. No JVD. Lungs are clear. Cardiac exam shows a regular rate and rhythm. Abdomen is soft. Extremities are without edema. Gait and ROM are intact. No gross neurologic deficits noted.  LABORATORY DATA: Fasting labs pending for today.  Lab Results  Component Value Date   WBC 8.4 07/28/2013   HGB 12.1* 07/28/2013   HCT 34.9* 07/28/2013   PLT 156 07/28/2013   GLUCOSE 86 07/28/2013   CHOL 141 07/27/2013   TRIG 64 07/27/2013   HDL 63 07/27/2013   LDLCALC 65 07/27/2013   ALT 16 07/27/2013   AST 22 07/27/2013   NA 138 07/28/2013   K 4.2 07/28/2013   CL 104 07/28/2013   CREATININE 0.59 07/28/2013   BUN 8 07/28/2013   CO2 26 07/28/2013   TSH 0.86 06/17/2013   PSA 1.53 11/22/2009   INR 2.02* 07/27/2013   HGBA1C 5.8* 07/27/2013   Coronary angiography:  Coronary dominance: right  Left mainstem: Patent with 20-30% distal left main stenosis. The left main arises from the left cusp it divides into the LAD and left circumflex.  Left anterior descending (LAD): The LAD is moderately calcified. The vessel is diffusely diseased with moderate mid vessel stenosis. There is some  intramyocardial bridging present. I would estimate the mid vessel stenosis at 50-60%. The proximal vessel has diffuse 40-50% stenosis. The first diagonal has 75-80% ostial stenosis.  Left circumflex (LCx): The circumflex is medium in caliber. The vessel gives off a single OM branch with no significant stenosis.  The proximal circumflex has a dilated segment followed by 30-40% stenosis.  Right coronary artery (RCA): The right coronary artery is a large, dominant vessel. There are diffuse irregularities in the proximal vessel. There is mild calcification present. The distal RCA is totally occluded with contrast staining suggestive of acute occlusion. There is no collateral filling of the PDA or PLB.  Left ventriculography: Left ventricular systolic function is low normal. There is hypokinesis of the basal and midinferior walls. The other LV wall segments are normal.  PCI Note: Following the diagnostic procedure, the decision was made to proceed with PCI. Weight-based bivalirudin was given for anticoagulation. Once a therapeutic ACT was achieved, a 6 [place] JR 4 guide catheter was inserted. A cougar coronary guidewire was used to cross the lesion. The lesion was predilated with a 2.5 x 15 mm balloon. The lesion was then stented with a 3.5 x 20 mm Promus premier drug-eluting stent. The stent was postdilated with a 3.75 mm noncompliant balloon. Following PCI, there was 0% residual stenosis and TIMI-3 flow. Final angiography confirmed an excellent result. The patient tolerated the procedure well. There were no immediate procedural complications. A TR band was used for radial hemostasis. The patient was transferred to the post catheterization recovery area for further monitoring.  PCI Data:  Vessel - RCA/Segment - distal  Percent Stenosis (pre) 100  TIMI-flow 0  Stent 3.5 x 20 mm Promus DES  Percent Stenosis (post) 0  TIMI-flow (post) 3   Final Conclusions:  1. Acute inferolateral myocardial infarction  secondary to acute occlusion of the distal RCA, treated successfully with primary PCI using a drug-eluting stent  2. Moderate LAD stenosis  3. Mild left circumflex stenosis  4. Mild segmental left ventricular dysfunction with preserved overall LVEF  Recommendations:  The patient will require dual antiplatelet therapy with aspirin and brilinta for at least one year. He did not have good ST segment resolution at the completion of the procedure. I suspect this is related to distal embolization as the distal most portions of the PLA branches have that appearance. Despite his residual ST elevation, his chest pain is markedly improved. Will continue bivalirudin for 2 hours.  Sherren Mocha  07/27/2013, 2:54 PM    Echo Study Conclusions  - Left ventricle: The cavity size was normal. Wall thickness was normal. Systolic function was normal. The estimated ejection fraction was in the range of 55% to 60%. There is hypokinesis of the basal inferior posterior myocardium. Doppler parameters are consistent with abnormal left ventricular relaxation (grade 1 diastolic dysfunction). - Aortic valve: Trivial regurgitation. - Mitral valve: Calcified annulus. Mildly thickened leaflets . Mild regurgitation. - Right ventricle: The cavity size was mildly dilated. - Right atrium: The atrium was mildly dilated. - Pulmonary arteries: PA peak pressure: 58mm Hg (S).  Assessment / Plan:   1. Inferolateral MI treated with DES to the distal RCA - has residual nonobstructive CAD - to manage medically - I think he is doing well clinically. Continue with cardiac rehab. Ok to resume his regular activities at home as well. See back in 3 months. No change in his medicines.   2. HLD - on statin - rechecking lipids today  3. Tobacco abuse - continued cessation is encouraged   4. Resting bradycardia - no passing out spells reported.   See him back in 3 months.   Patient is agreeable to this plan and will call if any  problems develop in the interim.   Burtis Junes, RN, ANP-C  [facility]  [address] [place]  [place], [place] [address]

## 2013-09-15 NOTE — Patient Instructions (Addendum)
We need to check labs today.   Keep trying to stop smoking  Continue with cardiac rehab.  See Dr. Burt Knack in 3 months  Call the [place] office at [phone] if you have any questions, problems or concerns.

## 2013-09-16 ENCOUNTER — Encounter (HOSPITAL_COMMUNITY)
Admission: RE | Admit: 2013-09-16 | Discharge: 2013-09-16 | Disposition: A | Discharge status: Home / Self Care | Payer: Medicare Other | Source: Ambulatory Visit | Attending: Cardiovascular Disease | Admitting: Cardiovascular Disease

## 2013-09-18 ENCOUNTER — Encounter (HOSPITAL_COMMUNITY)
Admission: RE | Admit: 2013-09-18 | Discharge: 2013-09-18 | Disposition: A | Discharge status: Home / Self Care | Payer: Medicare Other | Source: Ambulatory Visit | Attending: Cardiovascular Disease | Admitting: Cardiovascular Disease

## 2013-09-21 ENCOUNTER — Encounter (HOSPITAL_COMMUNITY)
Admission: RE | Admit: 2013-09-21 | Discharge: 2013-09-21 | Disposition: A | Discharge status: Home / Self Care | Payer: Medicare Other | Source: Ambulatory Visit | Attending: Cardiovascular Disease | Admitting: Cardiovascular Disease

## 2013-09-23 ENCOUNTER — Encounter (HOSPITAL_COMMUNITY)
Admission: RE | Admit: 2013-09-23 | Discharge: 2013-09-23 | Disposition: A | Discharge status: Home / Self Care | Payer: Medicare Other | Source: Ambulatory Visit | Attending: Cardiovascular Disease | Admitting: Cardiovascular Disease

## 2013-09-25 ENCOUNTER — Telehealth: Payer: Self-pay | Admitting: Nurse Practitioner

## 2013-09-25 ENCOUNTER — Encounter (HOSPITAL_COMMUNITY)
Admission: RE | Admit: 2013-09-25 | Discharge: 2013-09-25 | Disposition: A | Discharge status: Home / Self Care | Payer: Medicare Other | Source: Ambulatory Visit | Attending: Cardiovascular Disease | Admitting: Cardiovascular Disease

## 2013-09-25 NOTE — Telephone Encounter (Signed)
New problem    The Dermatologist would like to take a growth off his chin.  Pt has questions about this   BRILINTA  Effecting this.   Please give pt a call back.

## 2013-09-25 NOTE — Telephone Encounter (Signed)
S/w pt stated dermatologist is going to cauterize pt's growth but I stated can not come off of Brilinta for 12 months pt stated verbal understanding

## 2013-09-28 ENCOUNTER — Encounter (HOSPITAL_COMMUNITY)
Admission: RE | Admit: 2013-09-28 | Discharge: 2013-09-28 | Disposition: A | Discharge status: Home / Self Care | Payer: Medicare Other | Source: Ambulatory Visit | Attending: Cardiovascular Disease | Admitting: Cardiovascular Disease

## 2013-09-30 ENCOUNTER — Encounter (HOSPITAL_COMMUNITY)
Admission: RE | Admit: 2013-09-30 | Discharge: 2013-09-30 | Disposition: A | Discharge status: Home / Self Care | Payer: Medicare Other | Source: Ambulatory Visit | Attending: Cardiovascular Disease | Admitting: Cardiovascular Disease

## 2013-10-02 ENCOUNTER — Encounter (HOSPITAL_COMMUNITY)
Admission: RE | Admit: 2013-10-02 | Discharge: 2013-10-02 | Disposition: A | Discharge status: Home / Self Care | Payer: Medicare Other | Source: Ambulatory Visit | Attending: Cardiovascular Disease | Admitting: Cardiovascular Disease

## 2013-10-05 ENCOUNTER — Encounter (HOSPITAL_COMMUNITY)
Admission: RE | Admit: 2013-10-05 | Discharge: 2013-10-05 | Disposition: A | Discharge status: Home / Self Care | Payer: Medicare Other | Source: Ambulatory Visit | Attending: Cardiovascular Disease | Admitting: Cardiovascular Disease

## 2013-10-07 ENCOUNTER — Encounter (HOSPITAL_COMMUNITY)
Admission: RE | Admit: 2013-10-07 | Discharge: 2013-10-07 | Disposition: A | Discharge status: Home / Self Care | Payer: Medicare Other | Source: Ambulatory Visit | Attending: Cardiovascular Disease | Admitting: Cardiovascular Disease

## 2013-10-09 ENCOUNTER — Encounter (HOSPITAL_COMMUNITY)
Admission: RE | Admit: 2013-10-09 | Discharge: 2013-10-09 | Disposition: A | Discharge status: Home / Self Care | Payer: Medicare Other | Source: Ambulatory Visit | Attending: Cardiovascular Disease | Admitting: Cardiovascular Disease

## 2013-10-12 ENCOUNTER — Encounter (HOSPITAL_COMMUNITY)
Admission: RE | Admit: 2013-10-12 | Discharge: 2013-10-12 | Disposition: A | Discharge status: Home / Self Care | Payer: Medicare Other | Source: Ambulatory Visit | Attending: Cardiovascular Disease | Admitting: Cardiovascular Disease

## 2013-10-12 DIAGNOSIS — J4489 Other specified chronic obstructive pulmonary disease: Secondary | ICD-10-CM | POA: Insufficient documentation

## 2013-10-12 DIAGNOSIS — I2510 Atherosclerotic heart disease of native coronary artery without angina pectoris: Secondary | ICD-10-CM | POA: Insufficient documentation

## 2013-10-12 DIAGNOSIS — I498 Other specified cardiac arrhythmias: Secondary | ICD-10-CM | POA: Insufficient documentation

## 2013-10-12 DIAGNOSIS — Z5189 Encounter for other specified aftercare: Secondary | ICD-10-CM | POA: Insufficient documentation

## 2013-10-12 DIAGNOSIS — I4729 Other ventricular tachycardia: Secondary | ICD-10-CM | POA: Insufficient documentation

## 2013-10-12 DIAGNOSIS — I519 Heart disease, unspecified: Secondary | ICD-10-CM | POA: Insufficient documentation

## 2013-10-12 DIAGNOSIS — Z79899 Other long term (current) drug therapy: Secondary | ICD-10-CM | POA: Insufficient documentation

## 2013-10-12 DIAGNOSIS — I472 Ventricular tachycardia, unspecified: Secondary | ICD-10-CM | POA: Insufficient documentation

## 2013-10-12 DIAGNOSIS — I2119 ST elevation (STEMI) myocardial infarction involving other coronary artery of inferior wall: Secondary | ICD-10-CM | POA: Insufficient documentation

## 2013-10-12 DIAGNOSIS — Z8249 Family history of ischemic heart disease and other diseases of the circulatory system: Secondary | ICD-10-CM | POA: Insufficient documentation

## 2013-10-12 DIAGNOSIS — F17200 Nicotine dependence, unspecified, uncomplicated: Secondary | ICD-10-CM | POA: Insufficient documentation

## 2013-10-12 DIAGNOSIS — K219 Gastro-esophageal reflux disease without esophagitis: Secondary | ICD-10-CM | POA: Insufficient documentation

## 2013-10-12 DIAGNOSIS — J449 Chronic obstructive pulmonary disease, unspecified: Secondary | ICD-10-CM | POA: Insufficient documentation

## 2013-10-14 ENCOUNTER — Encounter (HOSPITAL_COMMUNITY)
Admission: RE | Admit: 2013-10-14 | Discharge: 2013-10-14 | Disposition: A | Discharge status: Home / Self Care | Payer: Medicare Other | Source: Ambulatory Visit | Attending: Cardiovascular Disease | Admitting: Cardiovascular Disease

## 2013-10-16 ENCOUNTER — Encounter (HOSPITAL_COMMUNITY)
Admission: RE | Admit: 2013-10-16 | Discharge: 2013-10-16 | Disposition: A | Discharge status: Home / Self Care | Payer: Medicare Other | Source: Ambulatory Visit | Attending: Cardiovascular Disease | Admitting: Cardiovascular Disease

## 2013-10-19 ENCOUNTER — Encounter (HOSPITAL_COMMUNITY)
Admission: RE | Admit: 2013-10-19 | Discharge: 2013-10-19 | Disposition: A | Discharge status: Home / Self Care | Payer: Medicare Other | Source: Ambulatory Visit | Attending: Cardiovascular Disease | Admitting: Cardiovascular Disease

## 2013-10-21 ENCOUNTER — Encounter (HOSPITAL_COMMUNITY)
Admission: RE | Admit: 2013-10-21 | Discharge: 2013-10-21 | Disposition: A | Discharge status: Home / Self Care | Payer: Medicare Other | Source: Ambulatory Visit | Attending: Cardiovascular Disease | Admitting: Cardiovascular Disease

## 2013-10-23 ENCOUNTER — Encounter (HOSPITAL_COMMUNITY)
Admission: RE | Admit: 2013-10-23 | Discharge: 2013-10-23 | Disposition: A | Discharge status: Home / Self Care | Payer: Medicare Other | Source: Ambulatory Visit | Attending: Cardiovascular Disease | Admitting: Cardiovascular Disease

## 2013-10-23 ENCOUNTER — Telehealth: Payer: Self-pay | Admitting: Cardiovascular Disease

## 2013-10-23 NOTE — Telephone Encounter (Signed)
Pt is concerned because he read the side effects about Brilinta.  Concerned because he has some bruising.  Explained to pt the type of bruising that would be of concern with are the ones that occur in which they are enlarging instead of resolving and once in which develop a hard knot under the skin and don't appear to be resolving.  He states understanding and will call back if he develops anything like this.

## 2013-10-23 NOTE — Telephone Encounter (Signed)
New message         Pt has a question about the medication brilinta 90mg 

## 2013-10-26 ENCOUNTER — Encounter (HOSPITAL_COMMUNITY)
Admission: RE | Admit: 2013-10-26 | Discharge: 2013-10-26 | Disposition: A | Discharge status: Home / Self Care | Payer: Medicare Other | Source: Ambulatory Visit | Attending: Cardiovascular Disease | Admitting: Cardiovascular Disease

## 2013-10-28 ENCOUNTER — Encounter (HOSPITAL_COMMUNITY)
Admission: RE | Admit: 2013-10-28 | Discharge: 2013-10-28 | Disposition: A | Discharge status: Home / Self Care | Payer: Medicare Other | Source: Ambulatory Visit | Attending: Cardiovascular Disease | Admitting: Cardiovascular Disease

## 2013-10-30 ENCOUNTER — Encounter (HOSPITAL_COMMUNITY)
Admission: RE | Admit: 2013-10-30 | Discharge: 2013-10-30 | Disposition: A | Discharge status: Home / Self Care | Payer: Medicare Other | Source: Ambulatory Visit | Attending: Cardiovascular Disease | Admitting: Cardiovascular Disease

## 2013-11-02 ENCOUNTER — Encounter (HOSPITAL_COMMUNITY)
Admission: RE | Admit: 2013-11-02 | Discharge: 2013-11-02 | Disposition: A | Discharge status: Home / Self Care | Payer: Medicare Other | Source: Ambulatory Visit | Attending: Cardiovascular Disease | Admitting: Cardiovascular Disease

## 2013-11-04 ENCOUNTER — Encounter (HOSPITAL_COMMUNITY)
Admission: RE | Admit: 2013-11-04 | Discharge: 2013-11-04 | Disposition: A | Discharge status: Home / Self Care | Payer: Medicare Other | Source: Ambulatory Visit | Attending: Cardiovascular Disease | Admitting: Cardiovascular Disease

## 2013-11-06 ENCOUNTER — Encounter (HOSPITAL_COMMUNITY): Payer: Medicare Other

## 2013-11-09 ENCOUNTER — Encounter (HOSPITAL_COMMUNITY)
Admission: RE | Admit: 2013-11-09 | Discharge: 2013-11-09 | Disposition: A | Discharge status: Home / Self Care | Payer: Medicare Other | Source: Ambulatory Visit | Attending: Cardiovascular Disease | Admitting: Cardiovascular Disease

## 2013-11-09 DIAGNOSIS — I472 Ventricular tachycardia, unspecified: Secondary | ICD-10-CM | POA: Insufficient documentation

## 2013-11-09 DIAGNOSIS — J4489 Other specified chronic obstructive pulmonary disease: Secondary | ICD-10-CM | POA: Insufficient documentation

## 2013-11-09 DIAGNOSIS — Z79899 Other long term (current) drug therapy: Secondary | ICD-10-CM | POA: Insufficient documentation

## 2013-11-09 DIAGNOSIS — I519 Heart disease, unspecified: Secondary | ICD-10-CM | POA: Insufficient documentation

## 2013-11-09 DIAGNOSIS — J449 Chronic obstructive pulmonary disease, unspecified: Secondary | ICD-10-CM | POA: Insufficient documentation

## 2013-11-09 DIAGNOSIS — Z5189 Encounter for other specified aftercare: Secondary | ICD-10-CM | POA: Insufficient documentation

## 2013-11-09 DIAGNOSIS — I498 Other specified cardiac arrhythmias: Secondary | ICD-10-CM | POA: Insufficient documentation

## 2013-11-09 DIAGNOSIS — Z8249 Family history of ischemic heart disease and other diseases of the circulatory system: Secondary | ICD-10-CM | POA: Insufficient documentation

## 2013-11-09 DIAGNOSIS — K219 Gastro-esophageal reflux disease without esophagitis: Secondary | ICD-10-CM | POA: Insufficient documentation

## 2013-11-09 DIAGNOSIS — I4729 Other ventricular tachycardia: Secondary | ICD-10-CM | POA: Insufficient documentation

## 2013-11-09 DIAGNOSIS — I2510 Atherosclerotic heart disease of native coronary artery without angina pectoris: Secondary | ICD-10-CM | POA: Insufficient documentation

## 2013-11-09 DIAGNOSIS — F17200 Nicotine dependence, unspecified, uncomplicated: Secondary | ICD-10-CM | POA: Insufficient documentation

## 2013-11-09 DIAGNOSIS — I2119 ST elevation (STEMI) myocardial infarction involving other coronary artery of inferior wall: Secondary | ICD-10-CM | POA: Insufficient documentation

## 2013-11-11 ENCOUNTER — Encounter (HOSPITAL_COMMUNITY)
Admission: RE | Admit: 2013-11-11 | Discharge: 2013-11-11 | Disposition: A | Discharge status: Home / Self Care | Payer: Medicare Other | Source: Ambulatory Visit | Attending: Cardiovascular Disease | Admitting: Cardiovascular Disease

## 2013-11-13 ENCOUNTER — Encounter (HOSPITAL_COMMUNITY)
Admission: RE | Admit: 2013-11-13 | Discharge: 2013-11-13 | Disposition: A | Discharge status: Home / Self Care | Payer: Medicare Other | Source: Ambulatory Visit | Attending: Cardiovascular Disease | Admitting: Cardiovascular Disease

## 2013-11-16 ENCOUNTER — Encounter (HOSPITAL_COMMUNITY)
Admission: RE | Admit: 2013-11-16 | Discharge: 2013-11-16 | Disposition: A | Discharge status: Home / Self Care | Payer: Medicare Other | Source: Ambulatory Visit | Attending: Cardiovascular Disease | Admitting: Cardiovascular Disease

## 2013-11-18 ENCOUNTER — Encounter (HOSPITAL_COMMUNITY)
Admission: RE | Admit: 2013-11-18 | Discharge: 2013-11-18 | Disposition: A | Discharge status: Home / Self Care | Payer: Medicare Other | Source: Ambulatory Visit | Attending: Cardiovascular Disease | Admitting: Cardiovascular Disease

## 2013-11-20 ENCOUNTER — Encounter (HOSPITAL_COMMUNITY)
Admission: RE | Admit: 2013-11-20 | Discharge: 2013-11-20 | Disposition: A | Discharge status: Home / Self Care | Payer: Medicare Other | Source: Ambulatory Visit | Attending: Cardiovascular Disease | Admitting: Cardiovascular Disease

## 2013-11-20 NOTE — Progress Notes (Addendum)
[employer] today and plans to continue exercise on his own at the gym.

## 2013-11-23 ENCOUNTER — Encounter (HOSPITAL_COMMUNITY): Payer: Medicare Other

## 2013-11-25 ENCOUNTER — Encounter (HOSPITAL_COMMUNITY): Payer: Medicare Other

## 2013-11-27 ENCOUNTER — Encounter (HOSPITAL_COMMUNITY): Payer: Medicare Other

## 2013-12-15 ENCOUNTER — Other Ambulatory Visit: Payer: Self-pay | Admitting: Internal Medicine

## 2013-12-16 ENCOUNTER — Ambulatory Visit: Payer: Medicare Other | Admitting: Internal Medicine

## 2013-12-24 ENCOUNTER — Other Ambulatory Visit: Payer: Self-pay | Admitting: *Deleted

## 2013-12-24 MED ORDER — ATORVASTATIN CALCIUM 20 MG PO TABS: 20.0000 mg | ORAL_TABLET | Freq: Every day | ORAL | Status: DC

## 2013-12-25 ENCOUNTER — Other Ambulatory Visit: Payer: Self-pay | Admitting: Physician Assistant

## 2014-01-01 ENCOUNTER — Ambulatory Visit (INDEPENDENT_AMBULATORY_CARE_PROVIDER_SITE_OTHER): Payer: Medicare Other | Admitting: Cardiovascular Disease

## 2014-01-01 ENCOUNTER — Encounter: Payer: Self-pay | Admitting: Cardiovascular Disease

## 2014-01-01 VITALS — BP 112/66 | HR 56 | Ht 71.0 in | Wt 135.0 lb

## 2014-01-01 DIAGNOSIS — I2510 Atherosclerotic heart disease of native coronary artery without angina pectoris: Secondary | ICD-10-CM

## 2014-01-01 DIAGNOSIS — E785 Hyperlipidemia, unspecified: Secondary | ICD-10-CM

## 2014-01-01 MED ORDER — ATORVASTATIN CALCIUM 10 MG PO TABS: ORAL_TABLET | ORAL | Status: DC

## 2014-01-01 NOTE — Progress Notes (Signed)
HPI:  80 year old gentleman presenting for followup evaluation. The patient presented in November 2014 with an inferolateral STEMI treated with primary PCI of the distal right coronary artery with a drug-eluting stent platform. He had moderate disease in the LAD with a recommendation for medical therapy. His post infarct LVEF was 55-60% and his hospital course was uncomplicated.  The patient has occasional left-sided chest pain. This is unlike the pain he experienced at the time of his MI. Symptoms have been self-limited. Chest pain is unrelated to exertion. His pain is nonradiating and not associated with any other symptoms. Other complaints include nausea occurring after breakfast, constipation, postural dizziness when bending forward, and memory impairment. He has been compliant with his medications.  Outpatient Encounter Prescriptions as of 01/01/2014  Medication Sig  . aspirin 81 MG chewable tablet Chew 1 tablet (81 mg total) by mouth daily.  Marland Kitchen atorvastatin (LIPITOR) 10 MG tablet TAKE 1 TABLET  BY MOUTH DAILY AT 6 PM.  . cholecalciferol (VITAMIN D) 400 UNITS TABS tablet Take 2,000 Units by mouth daily.   . clobetasol (TEMOVATE) 0.05 % cream Apply 1 application topically daily as needed (psoriasis).   Marland Kitchen desonide (DESOWEN) 0.05 % cream Apply 1 application topically 2 (two) times daily as needed (rash).   Marland Kitchen docusate sodium (COLACE) 100 MG capsule Take 100 mg by mouth daily as needed for mild constipation.  . fluocinonide cream (LIDEX) 3.71 % Apply 1 application topically 2 (two) times daily as needed (psoriasis).  Marland Kitchen loratadine (CLARITIN) 10 MG tablet Take 10 mg by mouth daily as needed for allergies.   . Multiple Vitamin (MULTIVITAMIN) tablet Take 1 tablet by mouth daily.  . Multiple Vitamins-Minerals (ICAPS AREDS FORMULA PO) Take by mouth daily.  . nitroGLYCERIN (NITROSTAT) 0.4 MG SL tablet Place 1 tablet (0.4 mg total) under the tongue every 5 (five) minutes as needed for chest pain.  Vladimir Faster Glycol-Propyl Glycol (SYSTANE OP) Place 1 drop into both eyes daily as needed (dry eye).  . SPIRIVA HANDIHALER 18 MCG inhalation capsule PLACE 1 CAPSULE (18 MCG TOTAL) INTO INHALER AND INHALE DAILY.  . Ticagrelor (BRILINTA) 90 MG TABS tablet Take 1 tablet (90 mg total) by mouth 2 (two) times daily.  . [DISCONTINUED] atorvastatin (LIPITOR) 20 MG tablet TAKE 1 TABLET (20 MG TOTAL) BY MOUTH DAILY AT 6 PM.  . [DISCONTINUED] tiotropium (SPIRIVA) 18 MCG inhalation capsule Place 18 mcg into inhaler and inhale daily as needed (shortness of breath).    No Known Allergies  Past Medical History  Diagnosis Date  . DJD (degenerative joint disease)     (no psoriatic arthritis per pt.); hx of left knee cortisone at [facility]  . Prostatitis     hx  . Allergic rhinitis   . ALLERGIC RHINITIS 11/22/2009  . COPD 11/22/2009  . GERD 11/22/2009  . PSORIASIS 11/22/2009  . Vitamin D deficiency 12/01/2010  . CAD (coronary artery disease)     a. s/p DES distal RCA, 20-30% sten in dist L main,  50-60% sten LAD,  40-50% sten prox vessel, 75-80% sten D1, 30-40% sten LCx, EF 55-60%  . Tobacco abuse     ROS: Negative except as per HPI  BP 112/66  Pulse 56  Ht 5\' 11"  (1.803 m)  Wt 135 lb (61.236 kg)  BMI 18.84 kg/m2  PHYSICAL EXAM: Pt is alert and oriented, thin elderly male in NAD HEENT: normal Neck: JVP - normal, carotids 2+= without bruits Lungs: Prolonged expiratory phase bilaterally CV: RRR  without murmur or gallop Abd: soft, NT, Positive BS, no hepatomegaly Ext: no C/C/E, distal pulses intact and equal Skin: warm/dry no rash  EKG:  Sinus bradycardia 56 beats per minute, age-indeterminate inferior MI, LVH.  ASSESSMENT AND PLAN: 1. 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 He should remain on dual antiplatelet therapy with aspirin and brilinta for a period of at least 12 months. I will see him back in 6 months for followup evaluation. His current chest pain symptoms are likely noncardiac and we discussed this at  length today. He has a strong desire to minimize prescription medications. I advised that I think he is currently on a minimal program. He has not been able to take a beta blocker or ACE inhibitor because of low blood pressure.  2. Hyperlipidemia. His cholesterol has been extremely low. He is a thin, elderly gentleman. I think his atorvastatin should be reduced to 10 mg daily.  3. Tobacco abuse. Cessation counseling done.  For followup, he'll return in 6 months with a lipid panel prior to that office visit.  Sherren Mocha 01/01/2014 3:16 PM

## 2014-01-01 NOTE — Patient Instructions (Signed)
Your physician has recommended you make the following change in your medication: DECREASE Atorvastatin to 10mg  daily  Your physician recommends that you return for a FASTING LIPID and LIVER Profile in 6 MONTHS--nothing to eat or drink after midnight, this can be done 1 WEEK prior to Dr Burt Knack appointment  Your physician wants you to follow-up in: 6 MONTHS with Dr Burt Knack.  You will receive a reminder letter in the mail two months in advance. If you don't receive a letter, please call our office to schedule the follow-up appointment.

## 2014-01-27 ENCOUNTER — Other Ambulatory Visit (HOSPITAL_BASED_OUTPATIENT_CLINIC_OR_DEPARTMENT_OTHER): Payer: Medicare Other

## 2014-01-27 ENCOUNTER — Encounter: Payer: Self-pay | Admitting: Oncology

## 2014-01-27 ENCOUNTER — Ambulatory Visit (HOSPITAL_BASED_OUTPATIENT_CLINIC_OR_DEPARTMENT_OTHER): Payer: Medicare Other | Admitting: Oncology

## 2014-01-27 ENCOUNTER — Telehealth: Payer: Self-pay | Admitting: Oncology

## 2014-01-27 VITALS — BP 127/49 | HR 63 | Temp 98.5°F | Resp 20 | Ht 71.0 in | Wt 135.2 lb

## 2014-01-27 DIAGNOSIS — D472 Monoclonal gammopathy: Secondary | ICD-10-CM

## 2014-01-27 DIAGNOSIS — D7289 Other specified disorders of white blood cells: Secondary | ICD-10-CM

## 2014-01-27 LAB — COMPREHENSIVE METABOLIC PANEL (CC13)
ALBUMIN: 3.2 g/dL — AB (ref 3.5–5.0)
ALT: 14 U/L (ref 0–55)
AST: 19 U/L (ref 5–34)
Alkaline Phosphatase: 115 U/L (ref 40–150)
Anion Gap: 8 mEq/L (ref 3–11)
BUN: 12 mg/dL (ref 7.0–26.0)
CO2: 25 mEq/L (ref 22–29)
Calcium: 8.6 mg/dL (ref 8.4–10.4)
Chloride: 107 mEq/L (ref 98–109)
Creatinine: 0.8 mg/dL (ref 0.7–1.3)
GLUCOSE: 120 mg/dL (ref 70–140)
Potassium: 4.2 mEq/L (ref 3.5–5.1)
Sodium: 140 mEq/L (ref 136–145)
Total Bilirubin: 0.48 mg/dL (ref 0.20–1.20)
Total Protein: 6.7 g/dL (ref 6.4–8.3)

## 2014-01-27 LAB — CBC WITH DIFFERENTIAL/PLATELET
BASO%: 0.2 % (ref 0.0–2.0)
Basophils Absolute: 0 10*3/uL (ref 0.0–0.1)
EOS ABS: 0 10*3/uL (ref 0.0–0.5)
EOS%: 0.3 % (ref 0.0–7.0)
HCT: 35.9 % — ABNORMAL LOW (ref 38.4–49.9)
HGB: 12 g/dL — ABNORMAL LOW (ref 13.0–17.1)
LYMPH%: 17.8 % (ref 14.0–49.0)
MCH: 32.6 pg (ref 27.2–33.4)
MCHC: 33.4 g/dL (ref 32.0–36.0)
MCV: 97.6 fL (ref 79.3–98.0)
MONO#: 0.6 10*3/uL (ref 0.1–0.9)
MONO%: 9.8 % (ref 0.0–14.0)
NEUT%: 71.9 % (ref 39.0–75.0)
NEUTROS ABS: 4.6 10*3/uL (ref 1.5–6.5)
Platelets: 188 10*3/uL (ref 140–400)
RBC: 3.68 10*6/uL — ABNORMAL LOW (ref 4.20–5.82)
RDW: 13.9 % (ref 11.0–14.6)
WBC: 6.5 10*3/uL (ref 4.0–10.3)
lymph#: 1.2 10*3/uL (ref 0.9–3.3)

## 2014-01-27 NOTE — Progress Notes (Signed)
Hematology and Oncology Follow Up Visit  Jon Hicks [id number] [birth year] 80 y.o. 01/27/2014 3:03 PM    Principle Diagnosis: 80 year old gentleman with the monoclonal protein in the form of IgG kappa by immunofixation. His M spike is about 1 g/dL, IgG level slightly elevated at 1860. 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   Current therapy: Observation and surveillance.   Interim History: Jon Hicks is a pleasant 80 year old gentleman presents for a followup by himself. Since his last visit he have developed myocardial infarction and currently it recovering from it. He is not reporting any chest pain or shortness of breath. But does report some fatigue and tiredness. He did not have any opportunistic infection. Not had any hospitalization. Had not had any signs or symptoms of immune dysregulation. He is a  functional 80 year old, lives with his wife and attends to most activities of daily living. He is able to drive and perform most daily functions.    Medications: I have reviewed the patient's current medications.  Current Outpatient Prescriptions  Medication Sig Dispense Refill  . aspirin 81 MG chewable tablet Chew 1 tablet (81 mg total) by mouth daily.      Marland Kitchen atorvastatin (LIPITOR) 10 MG tablet TAKE 1 TABLET  BY MOUTH DAILY AT 6 PM.  90 tablet  3  . cholecalciferol (VITAMIN D) 400 UNITS TABS tablet Take 2,000 Units by mouth daily.       . clobetasol (TEMOVATE) 0.05 % cream Apply 1 application topically daily as needed (psoriasis).       . CLOBETASOL PROPIONATE E 0.05 % emollient cream       . desonide (DESOWEN) 0.05 % cream Apply 1 application topically 2 (two) times daily as needed (rash).       Marland Kitchen docusate sodium (COLACE) 100 MG capsule Take 100 mg by mouth daily as needed for mild constipation.      . fluocinonide cream (LIDEX) 1.19 % Apply 1 application topically 2 (two) times daily as needed (psoriasis).      Marland Kitchen loratadine (CLARITIN) 10 MG tablet Take 10  mg by mouth daily as needed for allergies.       . Multiple Vitamin (MULTIVITAMIN) tablet Take 1 tablet by mouth daily.      . Multiple Vitamins-Minerals (ICAPS AREDS FORMULA PO) Take by mouth daily.      . nitroGLYCERIN (NITROSTAT) 0.4 MG SL tablet Place 1 tablet (0.4 mg total) under the tongue every 5 (five) minutes as needed for chest pain.  25 tablet  3  . Polyethyl Glycol-Propyl Glycol (SYSTANE OP) Place 1 drop into both eyes daily as needed (dry eye).      . SPIRIVA HANDIHALER 18 MCG inhalation capsule PLACE 1 CAPSULE (18 MCG TOTAL) INTO INHALER AND INHALE DAILY.  30 capsule  11  . Ticagrelor (BRILINTA) 90 MG TABS tablet Take 1 tablet (90 mg total) by mouth 2 (two) times daily.  60 tablet  11   No current facility-administered medications for this visit.    Allergies: No Known Allergies  Past Medical History, Surgical history, Social history, and Family History were reviewed and updated.  Review of Systems: Constitutional:  Negative for fever, chills, night sweats, anorexia, weight loss, pain. Cardiovascular: no chest pain or dyspnea on exertion Respiratory: no cough, shortness of breath, or wheezing Gastrointestinal: no abdominal pain, change in bowel habits, or black or bloody stools Genito-Urinary: no dysuria, trouble voiding, or hematuria Remaining ROS negative. Physical Exam: Blood pressure 127/49, pulse  63, temperature 98.5 F (36.9 C), temperature source Oral, resp. rate 20, height _0  (1.803 m), weight 135 lb 3.2 oz (61.326 kg). ECOG: 1 General appearance: alert Head: Normocephalic, without obvious abnormality, atraumatic Neck: no adenopathy, no thyroid masses. Lymph nodes: Cervical, supraclavicular, and axillary nodes normal. Heart:regular rate and rhythm, S1, S2 normal, no murmur, click, rub or gallop Lung:chest clear, no wheezing, rales, normal symmetric air entry Abdomin: soft, non-tender, without masses or organomegaly EXT:no evidence of edema   Lab  Results: Lab Results  Component Value Date   WBC 6.5 01/27/2014   HGB 12.0* 01/27/2014   HCT 35.9* 01/27/2014   MCV 97.6 01/27/2014   PLT 188 01/27/2014     Chemistry      Component Value Date/Time   NA 139 09/15/2013 0821   NA 139 01/28/2013 1444   K 4.1 09/15/2013 0821   K 4.3 01/28/2013 1444   CL 104 09/15/2013 0821   CL 103 01/28/2013 1444   CO2 29 09/15/2013 0821   CO2 29 01/28/2013 1444   BUN 12 09/15/2013 0821   BUN 13.6 01/28/2013 1444   CREATININE 0.8 09/15/2013 0821   CREATININE 0.8 01/28/2013 1444      Component Value Date/Time   CALCIUM 8.9 09/15/2013 0821   CALCIUM 8.7 01/28/2013 1444   ALKPHOS 101 09/15/2013 0821   ALKPHOS 91 01/28/2013 1444   AST 24 09/15/2013 0821   AST 18 01/28/2013 1444   ALT 21 09/15/2013 0821   ALT 14 01/28/2013 1444   BILITOT 0.7 09/15/2013 0821   BILITOT 0.42 01/28/2013 1444     Results for Jon Hicks, Jon Hicks (MRN [id number]) as of 01/27/2014 14:56  Ref. Range 01/28/2013 14:44  M-SPIKE, % No range found 1.14  SPE Interp. No range found *  IgG (Immunoglobin G), Serum Latest Range: [phone] mg/dL 1490  IgA Latest Range: 68-379 mg/dL 144  IgM, Serum Latest Range: 41-251 mg/dL 27 (L)  Total Protein, Serum Electrophoresis Latest Range: 6.0-8.3 g/dL 6.6  Kappa free light chain Latest Range: 0.33-1.94 mg/dL 1.80  Lambda Free Lght Chn Latest Range: 0.57-2.63 mg/dL 1.27  Kappa:Lambda Ratio Latest Range: 0.26-1.65  1.42   Impression and Plan: This is a pleasant 80 year old gentleman with the  following issues.  1. A monoclonal protein in the form of IgG kappa by immunofixation likely represents MGUS. His M spike is about 1 g/dL, IgG level is normal at this time. I see no evidence to suggest end organ damage or multiple myeloma. The plan is to continue follow up with routine SPEP and protein studies on annual bases.  2. Follow up: in 12 months or sooner if needed.     Wyatt Portela, MD 5/20/20153:03 PM

## 2014-01-27 NOTE — Telephone Encounter (Signed)
Gave pt appt for lab and Md for 2016

## 2014-01-28 ENCOUNTER — Other Ambulatory Visit: Payer: Medicare Other | Admitting: Lab

## 2014-01-28 ENCOUNTER — Ambulatory Visit: Payer: Medicare Other | Admitting: Oncology

## 2014-01-29 LAB — KAPPA/LAMBDA LIGHT CHAINS
KAPPA LAMBDA RATIO: 1.91 — AB (ref 0.26–1.65)
Kappa free light chain: 2.12 mg/dL — ABNORMAL HIGH (ref 0.33–1.94)
LAMBDA FREE LGHT CHN: 1.11 mg/dL (ref 0.57–2.63)

## 2014-01-29 LAB — SPEP & IFE WITH QIG
ALBUMIN ELP: 55.7 % — AB (ref 55.8–66.1)
ALPHA-1-GLOBULIN: 4.2 % (ref 2.9–4.9)
Alpha-2-Globulin: 9.2 % (ref 7.1–11.8)
BETA GLOBULIN: 5.8 % (ref 4.7–7.2)
Beta 2: 2.3 % — ABNORMAL LOW (ref 3.2–6.5)
Gamma Globulin: 22.8 % — ABNORMAL HIGH (ref 11.1–18.8)
IGG (IMMUNOGLOBIN G), SERUM: 1530 mg/dL (ref 650–1600)
IgA: 146 mg/dL (ref 68–379)
IgM, Serum: 24 mg/dL — ABNORMAL LOW (ref 41–251)
M-Spike, %: 1.09 g/dL
TOTAL PROTEIN, SERUM ELECTROPHOR: 6.7 g/dL (ref 6.0–8.3)

## 2014-02-12 ENCOUNTER — Ambulatory Visit (INDEPENDENT_AMBULATORY_CARE_PROVIDER_SITE_OTHER): Payer: Medicare Other | Admitting: Internal Medicine

## 2014-02-12 ENCOUNTER — Encounter: Payer: Self-pay | Admitting: Internal Medicine

## 2014-02-12 VITALS — BP 110/62 | HR 54 | Temp 98.4°F | Ht 71.0 in | Wt 134.2 lb

## 2014-02-12 DIAGNOSIS — K5900 Constipation, unspecified: Secondary | ICD-10-CM | POA: Insufficient documentation

## 2014-02-12 DIAGNOSIS — I2510 Atherosclerotic heart disease of native coronary artery without angina pectoris: Secondary | ICD-10-CM

## 2014-02-12 DIAGNOSIS — K219 Gastro-esophageal reflux disease without esophagitis: Secondary | ICD-10-CM

## 2014-02-12 DIAGNOSIS — J449 Chronic obstructive pulmonary disease, unspecified: Secondary | ICD-10-CM

## 2014-02-12 NOTE — Assessment & Plan Note (Signed)
Urged to take the miralax daily

## 2014-02-12 NOTE — Patient Instructions (Signed)
Please continue all other medications as before, and refills have been done if requested.  Please have the pharmacy call with any other refills you may need.  Please continue your efforts at being more active, low cholesterol diet, and weight control.  You are otherwise up to date with prevention measures today.  Please keep your appointments with your specialists as you may have planned  Please return in 6 months, or sooner if needed 

## 2014-02-12 NOTE — Progress Notes (Signed)
Subjective:    Patient ID: Jon Hicks, male    DOB: Sep 25, 1927, 80 y.o.   MRN: [id number]  HPI Here to f/u; overall doing ok,  Pt denies chest pain, increased sob or doe, wheezing, orthopnea, PND, increased LE swelling, palpitations, dizziness or syncope.  Pt denies polydipsia, polyuria, or low sugar symptoms such as weakness or confusion improved with po intake.  Pt denies new neurological symptoms such as new headache, or facial or extremity weakness or numbness.   Pt states overall good compliance with meds, has been trying to follow lower cholesterol diet, with wt overall stable,  but little exercise however.  Has ongoing recurrent constipatoin, but only takes the miralax intermittently.  Sees card/heme on regular basis, last seen may20  Denies worsening reflux, abd pain, dysphagia, n/v, bowel change or blood. Past Medical History  Diagnosis Date  . DJD (degenerative joint disease)     (no psoriatic arthritis per pt.); hx of left knee cortisone at [facility]  . Prostatitis     hx  . Allergic rhinitis   . ALLERGIC RHINITIS 11/22/2009  . COPD 11/22/2009  . GERD 11/22/2009  . PSORIASIS 11/22/2009  . Vitamin D deficiency 12/01/2010  . CAD (coronary artery disease)     a. s/p DES distal RCA, 20-30% sten in dist L main,  50-60% sten LAD,  40-50% sten prox vessel, 75-80% sten D1, 30-40% sten LCx, EF 55-60%  . Tobacco abuse    Past Surgical History  Procedure Laterality Date  . Cholecystectomy    . Edg      Dr. Miguel Rota esophageal nodule-normal tissue, 2000  . Cardiolite      normal exercise cardiolite 1999  . Urtheral      s/p urtheral stricture dilation-per urology-Dr. Hunt 1984  . Herniorrhapy      inguinal herniorrhapy-bilat    reports that he has been smoking Cigars.  He has never used smokeless tobacco. He reports that he does not drink alcohol or use illicit drugs. family history includes Arthritis in his mother; Heart disease in his father. No Known Allergies Current  Outpatient Prescriptions on File Prior to Visit  Medication Sig Dispense Refill  . aspirin 81 MG chewable tablet Chew 1 tablet (81 mg total) by mouth daily.      Marland Kitchen atorvastatin (LIPITOR) 10 MG tablet TAKE 1 TABLET  BY MOUTH DAILY AT 6 PM.  90 tablet  3  . cholecalciferol (VITAMIN D) 400 UNITS TABS tablet Take 2,000 Units by mouth daily.       . clobetasol (TEMOVATE) 0.05 % cream Apply 1 application topically daily as needed (psoriasis).       . CLOBETASOL PROPIONATE E 0.05 % emollient cream       . desonide (DESOWEN) 0.05 % cream Apply 1 application topically 2 (two) times daily as needed (rash).       Marland Kitchen docusate sodium (COLACE) 100 MG capsule Take 100 mg by mouth daily as needed for mild constipation.      . fluocinonide cream (LIDEX) 5.10 % Apply 1 application topically 2 (two) times daily as needed (psoriasis).      Marland Kitchen loratadine (CLARITIN) 10 MG tablet Take 10 mg by mouth daily as needed for allergies.       . Multiple Vitamin (MULTIVITAMIN) tablet Take 1 tablet by mouth daily.      . Multiple Vitamins-Minerals (ICAPS AREDS FORMULA PO) Take by mouth daily.      . nitroGLYCERIN (NITROSTAT) 0.4 MG SL tablet Place  1 tablet (0.4 mg total) under the tongue every 5 (five) minutes as needed for chest pain.  25 tablet  3  . Polyethyl Glycol-Propyl Glycol (SYSTANE OP) Place 1 drop into both eyes daily as needed (dry eye).      . SPIRIVA HANDIHALER 18 MCG inhalation capsule PLACE 1 CAPSULE (18 MCG TOTAL) INTO INHALER AND INHALE DAILY.  30 capsule  11  . Ticagrelor (BRILINTA) 90 MG TABS tablet Take 1 tablet (90 mg total) by mouth 2 (two) times daily.  60 tablet  11   No current facility-administered medications on file prior to visit.   Review of Systems  Constitutional: Negative for unusual diaphoresis or other sweats  HENT: Negative for ringing in ear Eyes: Negative for double vision or worsening visual disturbance.  Respiratory: Negative for choking and stridor.   Gastrointestinal: Negative for  vomiting or other signifcant bowel change Genitourinary: Negative for hematuria or decreased urine volume.  Musculoskeletal: Negative for other MSK pain or swelling Skin: Negative for color change and worsening wound.  Neurological: Negative for tremors and numbness other than noted  Psychiatric/Behavioral: Negative for decreased concentration or agitation other than above       Objective:   Physical Exam BP 110/62  Pulse 54  Temp(Src) 98.4 F (36.9 C) (Oral)  Ht 5\' 11"  (1.803 m)  Wt 134 lb 4 oz (60.895 kg)  BMI 18.73 kg/m2  SpO2 99% VS noted,  Constitutional: Pt appears well-developed, well-nourished.  HENT: Head: NCAT.  Right Ear: External ear normal.  Left Ear: External ear normal.  Eyes: . Pupils are equal, round, and reactive to light. Conjunctivae and EOM are normal Neck: Normal range of motion. Neck supple.  Cardiovascular: Normal rate and regular rhythm.   Pulmonary/Chest: Effort normal and breath sounds normal.  Abd:  Soft, NT, ND, + BS Neurological: Pt is alert. Not confused , motor grossly intact Skin: Skin is warm. No rash Psychiatric: Pt behavior is normal. No agitation.      Assessment & Plan:

## 2014-02-12 NOTE — Assessment & Plan Note (Signed)
stable overall by history and exam, recent data reviewed with pt, and pt to continue medical treatment as before,  to f/u any worsening symptoms or concerns Lab Results  Component Value Date   WBC 6.5 01/27/2014   HGB 12.0* 01/27/2014   HCT 35.9* 01/27/2014   PLT 188 01/27/2014   GLUCOSE 120 01/27/2014   CHOL 109 09/15/2013   TRIG 73.0 09/15/2013   HDL 59.50 09/15/2013   LDLCALC 35 09/15/2013   ALT 14 01/27/2014   AST 19 01/27/2014   NA 140 01/27/2014   K 4.2 01/27/2014   CL 104 09/15/2013   CREATININE 0.8 01/27/2014   BUN 12.0 01/27/2014   CO2 25 01/27/2014   TSH 0.86 06/17/2013   PSA 1.53 11/22/2009   INR 2.02* 07/27/2013   HGBA1C 5.8* 07/27/2013

## 2014-02-12 NOTE — Progress Notes (Signed)
Pre visit review using our clinic review tool, if applicable. No additional management support is needed unless otherwise documented below in the visit note. 

## 2014-02-12 NOTE — Assessment & Plan Note (Signed)
stable overall by history and exam, recent data reviewed with pt, and pt to continue medical treatment as before,  to f/u any worsening symptoms or concerns SpO2 Readings from Last 3 Encounters:  02/12/14 99%  09/15/13 100%  08/20/13 100%

## 2014-04-23 ENCOUNTER — Telehealth: Payer: Self-pay | Admitting: Cardiovascular Disease

## 2014-04-23 NOTE — Telephone Encounter (Signed)
I spoke with the pt and made him aware that he can take Tylenol as needed. The pt does have Aleve at home and I made him aware that he can use this sparingly for headache.

## 2014-04-23 NOTE — Telephone Encounter (Signed)
New Prob    Pt is calling for OTC recommendations for a HA. Please call.

## 2014-06-11 ENCOUNTER — Telehealth: Payer: Self-pay | Admitting: Cardiovascular Disease

## 2014-06-11 NOTE — Telephone Encounter (Signed)
Returned call to patient.  He is due to refill his Lipitor and was wondering if perhaps Dr Burt Knack may want to stop it.  He is due for labs on 10/20 and follow up on 10/23.  I let him know Dr Burt Knack will review his labs and then discuss with him at the office visit.  He will continue to take until follow up.

## 2014-06-11 NOTE — Telephone Encounter (Signed)
New problem:    Pt would like a call back about his medications ATORVASTATIN before his 10/23 appt please.

## 2014-06-29 ENCOUNTER — Other Ambulatory Visit (INDEPENDENT_AMBULATORY_CARE_PROVIDER_SITE_OTHER): Payer: Medicare Other | Admitting: *Deleted

## 2014-06-29 DIAGNOSIS — E785 Hyperlipidemia, unspecified: Secondary | ICD-10-CM

## 2014-06-29 DIAGNOSIS — I2510 Atherosclerotic heart disease of native coronary artery without angina pectoris: Secondary | ICD-10-CM

## 2014-06-29 LAB — HEPATIC FUNCTION PANEL
ALK PHOS: 116 U/L (ref 39–117)
ALT: 17 U/L (ref 0–53)
AST: 20 U/L (ref 0–37)
Albumin: 3.1 g/dL — ABNORMAL LOW (ref 3.5–5.2)
BILIRUBIN DIRECT: 0.1 mg/dL (ref 0.0–0.3)
TOTAL PROTEIN: 7.4 g/dL (ref 6.0–8.3)
Total Bilirubin: 0.8 mg/dL (ref 0.2–1.2)

## 2014-06-29 LAB — LIPID PANEL
CHOLESTEROL: 122 mg/dL (ref 0–200)
HDL: 55.6 mg/dL (ref >39.00)
LDL Cholesterol: 49 mg/dL (ref 0–99)
NonHDL: 66.4
TRIGLYCERIDES: 88 mg/dL (ref 0.0–149.0)
Total CHOL/HDL Ratio: 2
VLDL: 17.6 mg/dL (ref 0.0–40.0)

## 2014-07-02 ENCOUNTER — Ambulatory Visit (INDEPENDENT_AMBULATORY_CARE_PROVIDER_SITE_OTHER): Payer: Medicare Other | Admitting: Cardiovascular Disease

## 2014-07-02 ENCOUNTER — Encounter: Payer: Self-pay | Admitting: Cardiovascular Disease

## 2014-07-02 VITALS — BP 150/68 | HR 53 | Ht 71.0 in | Wt 132.8 lb

## 2014-07-02 DIAGNOSIS — I25810 Atherosclerosis of coronary artery bypass graft(s) without angina pectoris: Secondary | ICD-10-CM

## 2014-07-02 DIAGNOSIS — I2510 Atherosclerotic heart disease of native coronary artery without angina pectoris: Secondary | ICD-10-CM

## 2014-07-02 NOTE — Patient Instructions (Signed)
Your physician recommends that you continue on your current medications as directed. Please refer to the Current Medication list given to you today.  You can stop Brilinta when you finish your current bottle.   Your physician wants you to follow-up in: 6 MONTHS with Dr Burt Knack.  You will receive a reminder letter in the mail two months in advance. If you don't receive a letter, please call our office to schedule the follow-up appointment.

## 2014-07-02 NOTE — Progress Notes (Signed)
Background:  The patient is followed for coronary artery disease. He presented in November 2014 with an inferolateral STEMI treated with primary PCI of the distal right coronary artery with a drug-eluting stent platform. He had moderate disease in the LAD with a recommendation for medical therapy. His post infarct LVEF was 55-60% and his hospital course was uncomplicated.  HPI:  80 year old gentleman returning for followup evaluation. Overall he is doing okay. He's had no recurrence of angina. He has not taken nitroglycerin. He does complain of shortness of breath. The patient has long-standing COPD but he is concerned that brilinta is contributing to his breathing problems. He denies orthopnea, PND, or leg swelling. He has mild muscle aches and wants to get off of Lipitor. He's had some family members who feel strongly that statin drugs are bad. He has no chest pain or pressure with exertion. He continues to smoke about 3 cigarettes per day. Other complaints include dizziness and cough.   Outpatient Encounter Prescriptions as of 07/02/2014  Medication Sig  . aspirin 81 MG chewable tablet Chew 1 tablet (81 mg total) by mouth daily.  Marland Kitchen atorvastatin (LIPITOR) 10 MG tablet TAKE 1 TABLET  BY MOUTH DAILY AT 6 PM.  . cholecalciferol (VITAMIN D) 400 UNITS TABS tablet Take 2,000 Units by mouth daily.   . clobetasol (TEMOVATE) 0.05 % cream Apply 1 application topically daily as needed (psoriasis).   . CLOBETASOL PROPIONATE E 0.05 % emollient cream   . desonide (DESOWEN) 0.05 % cream Apply 1 application topically 2 (two) times daily as needed (rash).   Marland Kitchen docusate sodium (COLACE) 100 MG capsule Take 100 mg by mouth daily as needed for mild constipation.  . fluocinonide cream (LIDEX) 7.25 % Apply 1 application topically 2 (two) times daily as needed (psoriasis).  . fluticasone (FLONASE) 50 MCG/ACT nasal spray Place 2 sprays into both nostrils 2 (two) times daily.   Marland Kitchen loratadine (CLARITIN) 10 MG tablet Take  10 mg by mouth daily as needed for allergies.   . Multiple Vitamins-Minerals (ICAPS AREDS FORMULA PO) Take by mouth daily.  . nitroGLYCERIN (NITROSTAT) 0.4 MG SL tablet Place 1 tablet (0.4 mg total) under the tongue every 5 (five) minutes as needed for chest pain.  Vladimir Faster Glycol-Propyl Glycol (SYSTANE OP) Place 1 drop into both eyes daily as needed (dry eye).  . SPIRIVA HANDIHALER 18 MCG inhalation capsule PLACE 1 CAPSULE (18 MCG TOTAL) INTO INHALER AND INHALE DAILY.  . Ticagrelor (BRILINTA) 90 MG TABS tablet Take 1 tablet (90 mg total) by mouth 2 (two) times daily.  . [DISCONTINUED] Multiple Vitamin (MULTIVITAMIN) tablet Take 1 tablet by mouth daily.    No Known Allergies  Past Medical History  Diagnosis Date  . DJD (degenerative joint disease)     (no psoriatic arthritis per pt.); hx of left knee cortisone at [facility]  . Prostatitis     hx  . Allergic rhinitis   . ALLERGIC RHINITIS 11/22/2009  . COPD 11/22/2009  . GERD 11/22/2009  . PSORIASIS 11/22/2009  . Vitamin D deficiency 12/01/2010  . CAD (coronary artery disease)     a. s/p DES distal RCA, 20-30% sten in dist L main,  50-60% sten LAD,  40-50% sten prox vessel, 75-80% sten D1, 30-40% sten LCx, EF 55-60%  . Tobacco abuse     family history includes Arthritis in his mother; Heart disease in his father.   ROS: Negative except as per HPI  BP 150/68  Pulse 53  Ht 5'  11" (1.803 m)  Wt 132 lb 12.8 oz (60.238 kg)  BMI 18.53 kg/m2  PHYSICAL EXAM: Pt is alert and oriented, thin elderly gentleman NAD HEENT: normal Neck: JVP - normal, carotids 2+= without bruits Lungs: CTA bilaterally CV: RRR without murmur or gallop Abd: soft, NT, Positive BS, no hepatomegaly Ext: no C/C/E, distal pulses intact and equal Skin: warm/dry no rash  EKG:  Sinus bradycardia with sinus arrhythmia, earlier polarization pattern, heart rate 53 beats per minute.  ASSESSMENT AND PLAN: 1. 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 He is approaching 12 months out from  his myocardial infarction. I advised that he can stop brilinta after his current bottle runs out. He should continue on aspirin 81 mg daily for the long-term.  2. Hyperlipidemia. Atorvastatin was reduced several months ago to 10 mg daily. He is a thin, elderly gentleman. Recent lipids as below: Lipid Panel     Component Value Date/Time   CHOL 122 06/29/2014 0758   TRIG 88.0 06/29/2014 0758   HDL 55.60 06/29/2014 0758   CHOLHDL 2 06/29/2014 0758   VLDL 17.6 06/29/2014 0758   LDLCALC 49 06/29/2014 0758   The patient really wants to stop his statin drug. I advised that he first stop brilinta and then consider a holiday from atorvastatin for weeks. If his symptoms don't significantly improve with a statin holiday, I would like him to continue on this as I think there is clear benefit with respect to secondary risk reduction.  3. Tobacco abuse. Cessation counseling done.   Sherren Mocha 07/02/2014 10:56 AM

## 2014-07-14 ENCOUNTER — Other Ambulatory Visit: Payer: Self-pay | Admitting: Physician Assistant

## 2014-07-15 ENCOUNTER — Other Ambulatory Visit: Payer: Self-pay | Admitting: Cardiovascular Disease

## 2014-08-18 ENCOUNTER — Ambulatory Visit (INDEPENDENT_AMBULATORY_CARE_PROVIDER_SITE_OTHER): Payer: Medicare Other | Admitting: Internal Medicine

## 2014-08-18 ENCOUNTER — Other Ambulatory Visit: Payer: Self-pay | Admitting: Internal Medicine

## 2014-08-18 ENCOUNTER — Other Ambulatory Visit (INDEPENDENT_AMBULATORY_CARE_PROVIDER_SITE_OTHER): Payer: Medicare Other

## 2014-08-18 ENCOUNTER — Ambulatory Visit (INDEPENDENT_AMBULATORY_CARE_PROVIDER_SITE_OTHER)
Admission: RE | Admit: 2014-08-18 | Discharge: 2014-08-18 | Disposition: A | Discharge status: Home / Self Care | Payer: Medicare Other | Source: Ambulatory Visit | Attending: Internal Medicine | Admitting: Internal Medicine

## 2014-08-18 ENCOUNTER — Encounter: Payer: Self-pay | Admitting: Internal Medicine

## 2014-08-18 VITALS — BP 104/58 | HR 61 | Temp 97.5°F | Ht 71.0 in | Wt 127.0 lb

## 2014-08-18 DIAGNOSIS — D472 Monoclonal gammopathy: Secondary | ICD-10-CM | POA: Insufficient documentation

## 2014-08-18 DIAGNOSIS — J438 Other emphysema: Secondary | ICD-10-CM

## 2014-08-18 DIAGNOSIS — R0989 Other specified symptoms and signs involving the circulatory and respiratory systems: Secondary | ICD-10-CM

## 2014-08-18 DIAGNOSIS — R9389 Abnormal findings on diagnostic imaging of other specified body structures: Secondary | ICD-10-CM

## 2014-08-18 DIAGNOSIS — R918 Other nonspecific abnormal finding of lung field: Secondary | ICD-10-CM

## 2014-08-18 DIAGNOSIS — I2510 Atherosclerotic heart disease of native coronary artery without angina pectoris: Secondary | ICD-10-CM

## 2014-08-18 DIAGNOSIS — R0689 Other abnormalities of breathing: Secondary | ICD-10-CM | POA: Insufficient documentation

## 2014-08-18 DIAGNOSIS — R634 Abnormal weight loss: Secondary | ICD-10-CM

## 2014-08-18 DIAGNOSIS — Z23 Encounter for immunization: Secondary | ICD-10-CM

## 2014-08-18 DIAGNOSIS — R1310 Dysphagia, unspecified: Secondary | ICD-10-CM | POA: Insufficient documentation

## 2014-08-18 LAB — URINALYSIS, ROUTINE W REFLEX MICROSCOPIC
BILIRUBIN URINE: NEGATIVE
HGB URINE DIPSTICK: NEGATIVE
Leukocytes, UA: NEGATIVE
NITRITE: NEGATIVE
Specific Gravity, Urine: 1.02 (ref 1.000–1.030)
TOTAL PROTEIN, URINE-UPE24: NEGATIVE
URINE GLUCOSE: NEGATIVE
Urobilinogen, UA: 0.2 (ref 0.0–1.0)
pH: 5.5 (ref 5.0–8.0)

## 2014-08-18 LAB — BASIC METABOLIC PANEL
BUN: 16 mg/dL (ref 6–23)
CHLORIDE: 97 meq/L (ref 96–112)
CO2: 28 mEq/L (ref 19–32)
CREATININE: 0.7 mg/dL (ref 0.4–1.5)
Calcium: 8.6 mg/dL (ref 8.4–10.5)
GFR: 122.01 mL/min (ref >60.00)
Glucose, Bld: 102 mg/dL — ABNORMAL HIGH (ref 70–99)
POTASSIUM: 4.3 meq/L (ref 3.5–5.1)
SODIUM: 133 meq/L — AB (ref 135–145)

## 2014-08-18 LAB — CBC WITH DIFFERENTIAL/PLATELET
BASOS PCT: 0.4 % (ref 0.0–3.0)
Basophils Absolute: 0 10*3/uL (ref 0.0–0.1)
Eosinophils Absolute: 0 10*3/uL (ref 0.0–0.7)
Eosinophils Relative: 0.3 % (ref 0.0–5.0)
HCT: 41.3 % (ref 39.0–52.0)
Hemoglobin: 13.6 g/dL (ref 13.0–17.0)
Lymphocytes Relative: 21.8 % (ref 12.0–46.0)
Lymphs Abs: 1.9 10*3/uL (ref 0.7–4.0)
MCHC: 33 g/dL (ref 30.0–36.0)
MCV: 96.9 fl (ref 78.0–100.0)
Monocytes Absolute: 0.9 10*3/uL (ref 0.1–1.0)
Monocytes Relative: 10 % (ref 3.0–12.0)
Neutro Abs: 6 10*3/uL (ref 1.4–7.7)
Neutrophils Relative %: 67.5 % (ref 43.0–77.0)
Platelets: 250 10*3/uL (ref 150.0–400.0)
RBC: 4.26 Mil/uL (ref 4.22–5.81)
RDW: 14.1 % (ref 11.5–15.5)
WBC: 8.9 10*3/uL (ref 4.0–10.5)

## 2014-08-18 LAB — HEPATIC FUNCTION PANEL
ALT: 24 U/L (ref 0–53)
AST: 28 U/L (ref 0–37)
Albumin: 3.4 g/dL — ABNORMAL LOW (ref 3.5–5.2)
Alkaline Phosphatase: 155 U/L — ABNORMAL HIGH (ref 39–117)
BILIRUBIN TOTAL: 0.5 mg/dL (ref 0.2–1.2)
Bilirubin, Direct: 0.1 mg/dL (ref 0.0–0.3)
Total Protein: 7.2 g/dL (ref 6.0–8.3)

## 2014-08-18 LAB — TSH: TSH: 1.03 u[IU]/mL (ref 0.35–4.50)

## 2014-08-18 MED ORDER — FLUTICASONE-SALMETEROL 250-50 MCG/DOSE IN AEPB: 1.0000 | INHALATION_SPRAY | Freq: Two times a day (BID) | RESPIRATORY_TRACT | Status: AC

## 2014-08-18 MED ORDER — ALBUTEROL SULFATE HFA 108 (90 BASE) MCG/ACT IN AERS: 2.0000 | INHALATION_SPRAY | Freq: Four times a day (QID) | RESPIRATORY_TRACT | Status: AC | PRN

## 2014-08-18 MED ORDER — LEVOFLOXACIN 250 MG PO TABS: 250.0000 mg | ORAL_TABLET | Freq: Every day | ORAL | Status: DC

## 2014-08-18 NOTE — Assessment & Plan Note (Signed)
?   pulm cachexia vs dysphaiga vs other - for labs as ordered,  to f/u any worsening symptoms or concerns

## 2014-08-18 NOTE — Assessment & Plan Note (Signed)
Afeb, abnormal left bs - for cxr

## 2014-08-18 NOTE — Progress Notes (Signed)
Pre visit review using our clinic review tool, if applicable. No additional management support is needed unless otherwise documented below in the visit note. 

## 2014-08-18 NOTE — Patient Instructions (Signed)
Please take all new medication as prescribed - the albuterol inhaler as needed, as well as the Advair twice per day  Please continue all other medications as before, including the spiriva  Please have the pharmacy call with any other refills you may need.  Please continue your efforts at being more active, low cholesterol diet, and weight control.  You are otherwise up to date with prevention measures today.  Please keep your appointments with your specialists as you may have planned  Please go to the XRAY Department in the Basement (go straight as you get off the elevator) for the x-ray testing  Please go to the LAB in the Basement (turn left off the elevator) for the tests to be done today  You will be contacted by phone if any changes need to be made immediately.  Otherwise, you will receive a letter about your results with an explanation, but please check with MyChart first.  Please remember to sign up for MyChart if you have not done so, as this will be important to you in the future with finding out test results, communicating by private email, and scheduling acute appointments online when needed.  Please return in 3 months, or sooner if needed

## 2014-08-18 NOTE — Assessment & Plan Note (Addendum)
Symptomaticlaly worse, for add alb MDI prn, and advair 250 /50 bid   Note:  Total time for pt hx, exam, review of record with pt in the room, determination of diagnoses and plan for further eval and tx is > 40 min, with over 50% spent in coordination and counseling of patient

## 2014-08-18 NOTE — Progress Notes (Signed)
Subjective:    Patient ID: Jon Hicks, male    DOB: 01/15/1928, 80 y.o.   MRN: [id number]  HPI   Pt denies chest pain, increased sob or doe, wheezing, orthopnea, PND, increased LE swelling, palpitations, dizziness or syncope, though seems to have somehwat worsening breathing with prod cough x 2 wks, now using the spiriva just about every day as it seems to help. No recent fever.   Down to 1 cigar per day now. Still taking the statin. Stopped the brinllinta per card approx 1 mo ago. Pt denies new neurological symptoms such as new headache, or facial or extremity weakness or numbness   Pt denies polydipsia, polyuria,  Pt states overall good compliance with meds.  Does have occas dysphagia to some pills. And occas solids, not liquids., occurs to mild extent.  Does not want swallow test for now.  Has some loss of appetite, lost wt, occas nausea, better with pepcid ac Wt Readings from Last 3 Encounters:  08/18/14 127 lb (57.607 kg)  07/02/14 132 lb 12.8 oz (60.238 kg)  02/12/14 134 lb 4 oz (60.895 kg)  Does c/o ongoing fatigue, but denies signficant daytime hypersomnolence. Sees card/ oncology on reg basis  Past Medical History  Diagnosis Date  . DJD (degenerative joint disease)     (no psoriatic arthritis per pt.); hx of left knee cort[facility]one at [facility]  . Prostatitis     hx  . Allergic rhinitis   . ALLERGIC RHINITIS 11/22/2009  . COPD 11/22/2009  . GERD 11/22/2009  . PSORIASIS 11/22/2009  . Vitamin D deficiency 12/01/2010  . CAD (coronary artery disease)     a. s/p DES distal RCA, 20-30% sten in dist L main,  50-60% sten LAD,  40-50% sten prox vessel, 75-80% sten D1, 30-40% sten LCx, EF 55-60%  . Tobacco abuse    Past Surgical History  Procedure Laterality Date  . Cholecystectomy    . Edg      Dr. Miguel Rota esophageal nodule-normal tissue, 2000  . Cardiolite      normal exercise cardiolite 1999  . Urtheral      s/p urtheral stricture dilation-per urology-Dr. Hunt 1984  .  Herniorrhapy      inguinal herniorrhapy-bilat    reports that he has been smoking Cigars.  He has never used smokeless tobacco. He reports that he does not drink alcohol or use illicit drugs. family history includes Arthritis in his mother; Heart disease in his father. No Known Allergies Current Outpatient Prescriptions on File Prior to Visit  Medication Sig Dispense Refill  . aspirin 81 MG chewable tablet Chew 1 tablet (81 mg total) by mouth daily.    Marland Kitchen atorvastatin (LIPITOR) 10 MG tablet TAKE 1 TABLET  BY MOUTH DAILY AT 6 PM. 90 tablet 3  . cholecalciferol (VITAMIN D) 400 UNITS TABS tablet Take 2,000 Units by mouth daily.     . clobetasol (TEMOVATE) 0.05 % cream Apply 1 application topically daily as needed (psoriasis).     . CLOBETASOL PROPIONATE E 0.05 % emollient cream     . desonide (DESOWEN) 0.05 % cream Apply 1 application topically 2 (two) times daily as needed (rash).     Marland Kitchen docusate sodium (COLACE) 100 MG capsule Take 100 mg by mouth daily as needed for mild constipation.    . fluocinonide cream (LIDEX) 4.09 % Apply 1 application topically 2 (two) times daily as needed (psoriasis).    . fluticasone (FLONASE) 50 MCG/ACT nasal spray Place 2 sprays into  both nostrils 2 (two) times daily.     Marland Kitchen loratadine (CLARITIN) 10 MG tablet Take 10 mg by mouth daily as needed for allergies.     . Multiple Vitamins-Minerals (ICAPS AREDS FORMULA PO) Take by mouth daily.    . nitroGLYCERIN (NITROSTAT) 0.4 MG SL tablet Place 1 tablet (0.4 mg total) under the tongue every 5 (five) minutes as needed for chest pain. 25 tablet 3  . Polyethyl Glycol-Propyl Glycol (SYSTANE OP) Place 1 drop into both eyes daily as needed (dry eye).    . SPIRIVA HANDIHALER 18 MCG inhalation capsule PLACE 1 CAPSULE (18 MCG TOTAL) INTO INHALER AND INHALE DAILY. 30 capsule 11   No current facility-administered medications on file prior to visit.   Review of Systems  Constitutional: Negative for unusual diaphoresis or other  sweats  HENT: Negative for ringing in ear Eyes: Negative for double vision or worsening visual disturbance.  Respiratory: Negative for choking and stridor.   Gastrointestinal: Negative for vomiting or other signifcant bowel change Genitourinary: Negative for hematuria or decreased urine volume.  Musculoskeletal: Negative for other MSK pain or swelling Skin: Negative for color change and worsening wound.  Neurological: Negative for tremors and numbness other than noted  Psychiatric/Behavioral: Negative for decreased concentration or agitation other than above       Objective:   Physical Exam BP 104/58 mmHg  Pulse 61  Temp(Src) 97.5 F (36.4 C) (Oral)  Ht 5\' 11"  (1.803 m)  Wt 127 lb (57.607 kg)  BMI 17.72 kg/m2  SpO2 97% VS noted,  Constitutional: Pt appears well-developed, well-nourished.  HENT: Head: NCAT.  Right Ear: External ear normal.  Left Ear: External ear normal.  Eyes: . Pupils are equal, round, and reactive to light. Conjunctivae and EOM are normal Neck: Normal range of motion. Neck supple.  Cardiovascular: Normal rate and regular rhythm.   Pulmonary/Chest: Effort normal and breath sounds decr bilat, with left lower lung field crackles.  Abd:  Soft, NT, ND, + BS Neurological: Pt is alert. Not confused , motor grossly intact Skin: Skin is warm. No rash Psychiatric: Pt behavior is normal. No agitation.     Assessment & Plan:

## 2014-08-18 NOTE — Assessment & Plan Note (Signed)
Mild per pt, I wonder about risk of aspiration, delcines further eval for now though has had recent wt loss

## 2014-08-19 ENCOUNTER — Inpatient Hospital Stay: Admission: RE | Admit: 2014-08-19 | Payer: Medicare Other | Source: Ambulatory Visit

## 2014-08-19 ENCOUNTER — Encounter (HOSPITAL_COMMUNITY): Payer: Self-pay | Admitting: Cardiovascular Disease

## 2014-08-19 ENCOUNTER — Encounter: Payer: Self-pay | Admitting: Internal Medicine

## 2014-08-25 ENCOUNTER — Encounter: Payer: Self-pay | Admitting: Internal Medicine

## 2014-08-25 ENCOUNTER — Ambulatory Visit (INDEPENDENT_AMBULATORY_CARE_PROVIDER_SITE_OTHER): Payer: Medicare Other | Admitting: Internal Medicine

## 2014-08-25 ENCOUNTER — Ambulatory Visit (INDEPENDENT_AMBULATORY_CARE_PROVIDER_SITE_OTHER)
Admission: RE | Admit: 2014-08-25 | Discharge: 2014-08-25 | Disposition: A | Discharge status: Home / Self Care | Payer: Medicare Other | Source: Ambulatory Visit | Attending: Internal Medicine | Admitting: Internal Medicine

## 2014-08-25 VITALS — BP 120/60 | HR 61 | Ht 69.75 in | Wt 130.2 lb

## 2014-08-25 DIAGNOSIS — F17210 Nicotine dependence, cigarettes, uncomplicated: Secondary | ICD-10-CM | POA: Diagnosis not present

## 2014-08-25 DIAGNOSIS — J449 Chronic obstructive pulmonary disease, unspecified: Secondary | ICD-10-CM

## 2014-08-25 DIAGNOSIS — R918 Other nonspecific abnormal finding of lung field: Secondary | ICD-10-CM

## 2014-08-25 DIAGNOSIS — R938 Abnormal findings on diagnostic imaging of other specified body structures: Secondary | ICD-10-CM

## 2014-08-25 DIAGNOSIS — J432 Centrilobular emphysema: Secondary | ICD-10-CM

## 2014-08-25 DIAGNOSIS — J438 Other emphysema: Secondary | ICD-10-CM

## 2014-08-25 MED ORDER — IOHEXOL 300 MG/ML  SOLN
80.0000 mL | Freq: Once | INTRAMUSCULAR | Status: AC | PRN
  Administered 2014-08-25: 80 mL via INTRAVENOUS

## 2014-08-25 NOTE — Progress Notes (Signed)
   Subjective:    Patient ID: Jon Hicks, male    DOB: 28-May-1928,     MRN: [id number]  HPI  10 yowm smoker with doe x 2005 dx copd rx spiriva then added advair  by Jon Jon Hicks and referred to pulmonary clinic 08/25/2014    08/25/2014 1st Jon Hicks/ Jon Hicks   Chief Complaint  Patient presents with  . Pulmonary Consult    Referred by Jon Hicks for eval of pulmonary nodule. The pt c/o SOB and cough for the past couple of months. He states that he gets out of breath with "any work" and with "coughing fits".  His cough is prod with large amounts of sputum- white.   onset of decline abrupt/ correlates same time leak in roof requring repair completed around mid November 2015  Can walk flat ok  Worse sob correlates with cough spells no better p started advair  Has saba never uses  Cough is usually and more productive in am > variable white thin mucus never bloody  No obvious other patterns in day to day or daytime variabilty or assoc   cp or chest tightness, subjective wheeze overt sinus or hb symptoms. No unusual exp hx or h/o childhood pna/ asthma or knowledge of premature birth.  Sleeping ok without nocturnal  or early am exacerbation  of respiratory  c/o's or need for noct saba. Also denies any obvious fluctuation of symptoms with weather or environmental changes or other aggravating or alleviating factors except as outlined above   Current Medications, Allergies, Complete Past Medical History, Past Surgical History, Family History, and Social History were reviewed in [employer] record.              Review of Systems  Constitutional: Negative for fever, chills, activity change, appetite change and unexpected weight change.  HENT: Positive for sore throat and trouble swallowing. Negative for congestion, dental problem, postnasal drip, rhinorrhea, sneezing and voice change.   Eyes: Negative for visual disturbance.  Respiratory: Positive for  cough and shortness of breath. Negative for choking.   Cardiovascular: Negative for chest pain and leg swelling.  Gastrointestinal: Negative for nausea, vomiting and abdominal pain.  Genitourinary: Negative for difficulty urinating.  Musculoskeletal: Positive for arthralgias.  Skin: Positive for rash.  Psychiatric/Behavioral: Negative for behavioral problems and confusion.       Objective:   Physical Exam  amb wm nad   Wt Readings from Last 3 Encounters:  08/25/14 130 lb 3.2 oz (59.058 kg)  08/18/14 127 lb (57.607 kg)  07/02/14 132 lb 12.8 oz (60.238 kg)    Vital signs reviewed   HEENT mild turbinate edema.  Oropharynx no thrush or excess pnd or cobblestoning.  No JVD or cervical adenopathy. Mild accessory muscle hypertrophy. Trachea midline, nl thryroid. Chest was hyperinflated by percussion with diminished breath sounds and moderate increased exp time without wheeze. Hoover sign positive at mid inspiration. Regular rate and rhythm without murmur gallop or rub or increase P2 or edema.  Abd: no hsm, nl excursion. Ext warm without cyanosis or clubbing.    cxr 09/18/13 since 07/27/2013 marked change: IMPRESSION: 1. New, extensive nodular opacities throughout both lungs. Considerations include multifocal infection as well as neoplasm. Chest CT with IV contrast is recommended for further evaluation. 2. Small bilateral pleural effusions.      Assessment & Plan:

## 2014-08-25 NOTE — Assessment & Plan Note (Addendum)
Clinically moderate and relatively stable to spirva monotherapy probably is adequate and no better on advair so ok to stop.  Man ongoing concern is ongoing smoking - see sep a/p

## 2014-08-25 NOTE — Patient Instructions (Addendum)
Ok to continue your present inhalers and if short of breath or cough just use the rescue inhaler(albuterol) 2 puffs every 4 hours as needed   Come to outpatient registration at [facility] (behind the ER) at 715 am Tuesday Aug 31 2014 with nothing to eat or drink after midnight Monday .for Bronchoscopy   No aspirin after saturday 08/28/14

## 2014-08-25 NOTE — Assessment & Plan Note (Signed)
Unfortunately the pattern on cxr vs baseline 07/27/13 is most c/w met ca supported strongly on CT also with most likely primary = lung ca since smokes but also certainly could be GI   Discussed in detail all the  indications, usual  risks and alternatives  relative to the benefits with patient who agrees to proceed with bronchoscopy with biopsy.

## 2014-08-25 NOTE — Assessment & Plan Note (Signed)

## 2014-08-31 ENCOUNTER — Ambulatory Visit (HOSPITAL_COMMUNITY)
Admission: RE | Admit: 2014-08-31 | Discharge: 2014-08-31 | Disposition: A | Discharge status: Home / Self Care | Payer: Medicare Other | Source: Ambulatory Visit | Attending: Internal Medicine | Admitting: Internal Medicine

## 2014-08-31 ENCOUNTER — Ambulatory Visit (HOSPITAL_COMMUNITY): Payer: Medicare Other

## 2014-08-31 ENCOUNTER — Encounter (HOSPITAL_COMMUNITY)
Admission: RE | Disposition: A | Discharge status: Home / Self Care | Payer: Self-pay | Source: Ambulatory Visit | Attending: Internal Medicine

## 2014-08-31 DIAGNOSIS — C3490 Malignant neoplasm of unspecified part of unspecified bronchus or lung: Secondary | ICD-10-CM | POA: Diagnosis not present

## 2014-08-31 DIAGNOSIS — Z9889 Other specified postprocedural states: Secondary | ICD-10-CM

## 2014-08-31 DIAGNOSIS — F17210 Nicotine dependence, cigarettes, uncomplicated: Secondary | ICD-10-CM | POA: Insufficient documentation

## 2014-08-31 DIAGNOSIS — R911 Solitary pulmonary nodule: Secondary | ICD-10-CM | POA: Insufficient documentation

## 2014-08-31 DIAGNOSIS — R918 Other nonspecific abnormal finding of lung field: Secondary | ICD-10-CM

## 2014-08-31 DIAGNOSIS — J449 Chronic obstructive pulmonary disease, unspecified: Secondary | ICD-10-CM | POA: Diagnosis not present

## 2014-08-31 HISTORY — PX: VIDEO BRONCHOSCOPY: SHX5072

## 2014-08-31 SURGERY — BRONCHOSCOPY, WITH FLUOROSCOPY
Anesthesia: Moderate Sedation | Laterality: Bilateral

## 2014-08-31 MED ORDER — MEPERIDINE HCL 25 MG/ML IJ SOLN
INTRAMUSCULAR | Status: DC | PRN
  Administered 2014-08-31 (×2): 25 mg via INTRAVENOUS

## 2014-08-31 MED ORDER — PHENYLEPHRINE HCL 0.25 % NA SOLN: 1.0000 | Freq: Four times a day (QID) | NASAL | Status: DC | PRN

## 2014-08-31 MED ORDER — MIDAZOLAM HCL 10 MG/2ML IJ SOLN: 1.0000 mg | Freq: Once | INTRAMUSCULAR | Status: DC

## 2014-08-31 MED ORDER — LIDOCAINE HCL 2 % EX GEL
CUTANEOUS | Status: DC | PRN
  Administered 2014-08-31: 1

## 2014-08-31 MED ORDER — MEPERIDINE HCL 100 MG/ML IJ SOLN
INTRAMUSCULAR | Status: AC
  Filled 2014-08-31: qty 2

## 2014-08-31 MED ORDER — LIDOCAINE HCL 1 % IJ SOLN
INTRAMUSCULAR | Status: DC | PRN
  Administered 2014-08-31: 6 mL

## 2014-08-31 MED ORDER — LIDOCAINE HCL 2 % EX GEL: 1.0000 "application " | Freq: Once | CUTANEOUS | Status: DC

## 2014-08-31 MED ORDER — SODIUM CHLORIDE 0.9 % IV SOLN
INTRAVENOUS | Status: DC
  Administered 2014-08-31: 08:00:00 via INTRAVENOUS

## 2014-08-31 MED ORDER — PHENYLEPHRINE HCL 0.25 % NA SOLN
NASAL | Status: DC | PRN
  Administered 2014-08-31: 1 via NASAL

## 2014-08-31 MED ORDER — MIDAZOLAM HCL 10 MG/2ML IJ SOLN
INTRAMUSCULAR | Status: AC
  Filled 2014-08-31: qty 4

## 2014-08-31 MED ORDER — MEPERIDINE HCL 100 MG/ML IJ SOLN: 100.0000 mg | Freq: Once | INTRAMUSCULAR | Status: DC

## 2014-08-31 MED ORDER — MIDAZOLAM HCL 10 MG/2ML IJ SOLN
INTRAMUSCULAR | Status: DC | PRN
  Administered 2014-08-31 (×2): 2.5 mg via INTRAVENOUS

## 2014-08-31 NOTE — Progress Notes (Signed)
Changed cuff BP taken again  MD called  About BP  89/51

## 2014-08-31 NOTE — Op Note (Signed)
Bronchoscopy Procedure Note  Date of Operation: 08/31/2014   Pre-op Diagnosis: met lung ca  Post-op Diagnosis: same  Surgeon: Christinia Gully  Anesthesia: Monitored Local Anesthesia with Sedation  Operation: Video Flexible fiberoptic bronchoscopy, diagnostic   Findings: nl airways  Specimen: BAL LLL, Tbbx LL x 4   Estimated Blood Loss: 25 cc   Complications: bleeding controlled with suctioning and LLL down position  Indications and History: See updated H and P same date. The risks, benefits, complications, treatment options and expected outcomes were discussed with the patient.  The possibilities of reaction to medication, pulmonary aspiration, perforation of a viscus, bleeding, failure to diagnose a condition and creating a complication requiring transfusion or operation were discussed with the patient who freely signed the consent.    Description of Procedure: The patient was re-examined in the bronchoscopy suite and the site of surgery properly noted/marked.  The patient was identified  and the procedure verified as Flexible Fiberoptic Bronchoscopy.  A Time Out was held and the above information confirmed.   After the induction of topical nasopharyngeal anesthesia, the patient was positioned  and the bronchoscope was passed through the L  naris. The vocal cords were visualized and  1% buffered lidocaine 5 ml was topically placed onto the cords. The cords were nl. The scope was then passed into the trachea.  1% buffered lidocaine given topically. Airways inspected bilaterally to the subsegmental level with the following findings:  1) all airways nl to subsegmental level bilaterally   Interventions 1) BAL LLL 2) Tbbx x 4 LLL   Pt placed in L lat decub position after procedure and blood allowed to pool in LLL orifice but cleared from the LMSB AND LUL and the  R Side remained free of blood and the pt maintained adequate sats throughout with no new changes on cxr and adequate sats RA  and wife informed re the complication and warned should bleeding recur he should stay L side down and maintain off asa for now       Attestation: I performed the procedure.  Christinia Gully, MD Pulmonary and [place] [phone] After 5:30 PM or weekends, call [phone]

## 2014-08-31 NOTE — Progress Notes (Signed)
Video Bronchoscopy Done  Intervention Bronchial washings Intervention Bronchial biopsy

## 2014-08-31 NOTE — Discharge Instructions (Signed)
Flexible Bronchoscopy, Care After These instructions give you information on caring for yourself after your procedure. Your doctor may also give you more specific instructions. Call your doctor if you have any problems or questions after your procedure. HOME CARE  Do not eat or drink anything for 2 hours after your procedure. If you try to eat or drink before the medicine wears off, food or drink could go into your lungs. You could also burn yourself.  After 2 hours have passed and when you can cough and gag normally, you may eat soft food and drink liquids slowly.  The day after the test, you may eat your normal diet.  You may do your normal activities.  Keep all doctor visits. GET HELP RIGHT AWAY IF:  You get more and more short of breath.  You get light-headed.  You feel like you are going to pass out (faint).  You have chest pain.  You have new problems that worry you.  You cough up more than a little blood.  You cough up more blood than before. MAKE SURE YOU:  Understand these instructions.  Will watch your condition.  Will get help right away if you are not doing well or get worse. Document Released: 06/24/2009 Document Revised: 09/01/2013 Document Reviewed: 05/01/2013 [facility] Patient Information 2015 [place], [place]. This information is not intended to replace advice given to you by your health care provider. Make sure you discuss any questions you have with your health care provider.  Nothing to eat or drink until    11:00 am  Today  08/31/2014

## 2014-08-31 NOTE — H&P (Signed)
Subjective:    Patient ID: Jon Hicks, male DOB: Aug 30, 1928, MRN: [id number]   Brief patient profile: 77 yowm smoker with doe x 2005 dx copd rx spiriva then added advair by Dr Jenny Reichmann and referred to pulmonary clinic 08/25/2014   .History of Present Illness   08/25/2014 1st [facility] office visit/ Jon Hicks  Chief Complaint  Patient presents with  . Pulmonary Consult    Referred by Dr Cathlean Cower for eval of pulmonary nodule. The pt c/o SOB and cough for the past couple of months. He states that he gets out of breath with "any work" and with "coughing fits". His cough is prod with large amounts of sputum- white.   onset of decline abrupt/ correlates same time leak in roof requring repair completed around mid November 2015  Can walk flat ok  Worse sob correlates with cough spells no better p started advair  Has saba never uses  Cough is usually and more productive in am > variable white thin mucus never bloody  No obvious other patterns in day to day or daytime variabilty or assoc cp or chest tightness, subjective wheeze overt sinus or hb symptoms. No unusual exp hx or h/o childhood pna/ asthma or knowledge of premature birth.  Sleeping ok without nocturnal or early am exacerbation of respiratory c/o's or need for noct saba. Also denies any obvious fluctuation of symptoms with weather or environmental changes or other aggravating or alleviating factors except as outlined above   Current Medications, Allergies, Complete Past Medical History, Past Surgical History, Family History, and Social History were reviewed in [employer] record.        Review of Systems  Constitutional: Negative for fever, chills, activity change, appetite change and unexpected weight change.  HENT: Positive for sore throat and trouble swallowing. Negative for congestion, dental problem, postnasal drip, rhinorrhea, sneezing and voice change.  Eyes:  Negative for visual disturbance.  Respiratory: Positive for cough and shortness of breath. Negative for choking.  Cardiovascular: Negative for chest pain and leg swelling.  Gastrointestinal: Negative for nausea, vomiting and abdominal pain.  Genitourinary: Negative for difficulty urinating.  Musculoskeletal: Positive for arthralgias.  Skin: Positive for rash.  Psychiatric/Behavioral: Negative for behavioral problems and confusion.       Objective:   Physical Exam  amb wm nad   Wt Readings from Last 3 Encounters:  08/25/14 130 lb 3.2 oz (59.058 kg)  08/18/14 127 lb (57.607 kg)  07/02/14 132 lb 12.8 oz (60.238 kg)    Vital signs reviewed   HEENT mild turbinate edema. Oropharynx no thrush or excess pnd or cobblestoning. No JVD or cervical adenopathy. Mild accessory muscle hypertrophy. Trachea midline, nl thryroid. Chest was hyperinflated by percussion with diminished breath sounds and moderate increased exp time without wheeze. Hoover sign positive at mid inspiration. Regular rate and rhythm without murmur gallop or rub or increase P2 or edema. Abd: no hsm, nl excursion. Ext warm without cyanosis or clubbing.    cxr 09/18/13 since 07/27/2013 marked change: IMPRESSION: 1. New, extensive nodular opacities throughout both lungs. Considerations include multifocal infection as well as neoplasm. Chest CT with IV contrast is recommended for further evaluation. 2. Small bilateral pleural effusions.      Assessment & Plan:            Revision History       Date/Time User Action    > 08/25/2014 9:05 PM Tanda Rockers, MD Sign     08/25/2014 4:08 PM Legrand Como  Mckinley Jewel, MD Sign at close encounter     08/25/2014 3:37 PM Rosana Berger, CMA Sign at close encounter              Pulmonary infiltrates - Tanda Rockers, MD at 08/25/2014 9:01 PM     Status: Written Related Problem: Pulmonary infiltrates   Expand All Collapse All   Unfortunately the  pattern on cxr vs baseline 07/27/13 is most c/w met ca supported strongly on CT also with most likely primary = lung ca since smokes but also certainly could be GI   Discussed in detail all the indications, usual risks and alternatives relative to the benefits with patient who agrees to proceed with bronchoscopy with biopsy.             COPD (chronic obstructive pulmonary disease) - Tanda Rockers, MD at 08/25/2014 9:02 PM     Status: Alison Stalling Related Problem: COPD (chronic obstructive pulmonary disease)   Expand All Collapse All   Clinically moderate and relatively stable to spirva monotherapy probably is adequate and no better on advair so ok to stop.  Man ongoing concern is ongoing smoking - see sep a/p         Revision History       Date/Time User Action    > 08/25/2014 9:03 PM Tanda Rockers, MD Edit     08/25/2014 9:02 PM Tanda Rockers, MD Create              Cigarette smoker - Tanda Rockers, MD at 08/25/2014 9:04 PM     Status: Written Related Problem: Cigarette smoker   Expand All Collapse All   > 3 min discussion I emphasized that although we never turn away smokers from the pulmonary clinic, we do ask that they understand that the recommendations that we make won't work nearly as well in the presence of continued cigarette exposure.  In fact, we may very well reach a point where we can't promise to help the patient if he/she can't quit smoking. (We can and will promise to try to help, we just can't promise what we recommend will really work)              Not recorded        Patient Instructions     Ok to continue your present inhalers and if short of breath or cough just use the rescue inhaler(albuterol) 2 puffs every 4 hours as needed   Come to outpatient registration at [facility] (behind the ER) at 715 am Tuesday Aug 31 2014 with nothing to eat or drink after midnight Monday .for Bronchoscopy  No aspirin  after saturday 08/28/14    .08/31/2014 day of FOB: Still coughing, slt yellow never bloody. Breathing about the same / says has quit smoking  Otherwise h and p same as 08/25/14      Christinia Gully, MD Pulmonary and [place] [phone] After 5:30 PM or weekends, call [phone]

## 2014-09-01 ENCOUNTER — Encounter (HOSPITAL_COMMUNITY): Payer: Self-pay | Admitting: Internal Medicine

## 2014-09-01 ENCOUNTER — Telehealth: Payer: Self-pay | Admitting: Internal Medicine

## 2014-09-01 NOTE — Telephone Encounter (Signed)
Pt aware of rec's per Dr Melvyn Novas. Nothing further needed.

## 2014-09-01 NOTE — Telephone Encounter (Signed)
Pt states that he has been coughing up blood since having bronchoscopy.  Pt states that he has been coughing up x 2 days - Bright red yesterday, all blood very little sputum mixed. Today little blood(light red) mixed with sputum. Denies chest discomfort/pain.   MW please advise. Thanks.

## 2014-09-01 NOTE — Telephone Encounter (Signed)
To be expected, should gradually taper off x another 24 h call back tomorrow if this is not the case Best position to control it is lying flat with L side down so shoulders are  Rotated 90 degrees (ie perpendicular) to bed

## 2014-09-06 ENCOUNTER — Telehealth: Payer: Self-pay | Admitting: Internal Medicine

## 2014-09-06 ENCOUNTER — Other Ambulatory Visit: Payer: Self-pay | Admitting: Internal Medicine

## 2014-09-06 DIAGNOSIS — R918 Other nonspecific abnormal finding of lung field: Secondary | ICD-10-CM

## 2014-09-06 MED ORDER — ACETAMINOPHEN-CODEINE #3 300-30 MG PO TABS: 1.0000 | ORAL_TABLET | ORAL | Status: DC | PRN

## 2014-09-06 NOTE — Progress Notes (Signed)
Quick Note:  Order was sent to [facility] ______

## 2014-09-06 NOTE — Telephone Encounter (Signed)
Try tylenol #3 1 every 4 hours as needed for cough  Give #40

## 2014-09-06 NOTE — Telephone Encounter (Signed)
Called and spoke with pt  And he stated that he is having a constant cough with nausea and this cough is waking him up at night.  He said sometimes it is hard to swallow.  He stated sometimes with the cough he is coughing up sputum mixed with blood that is not bright red or very dark.  He is having a sore throat and this has been going on for about 1 month.   Pt is wanting recs from MW.  Please advise. Thanks  No Known Allergies  Current Outpatient Prescriptions on File Prior to Visit  Medication Sig Dispense Refill  . albuterol (PROVENTIL HFA;VENTOLIN HFA) 108 (90 BASE) MCG/ACT inhaler Inhale 2 puffs into the lungs every 6 (six) hours as needed for wheezing or shortness of breath. 1 Inhaler 11  . atorvastatin (LIPITOR) 10 MG tablet TAKE 1 TABLET  BY MOUTH DAILY AT 6 PM. 90 tablet 3  . cholecalciferol (VITAMIN D) 400 UNITS TABS tablet Take 2,000 Units by mouth daily.     . clobetasol (TEMOVATE) 0.05 % cream Apply 1 application topically daily as needed (psoriasis).     . CLOBETASOL PROPIONATE E 0.05 % emollient cream     . desonide (DESOWEN) 0.05 % cream Apply 1 application topically 2 (two) times daily as needed (rash).     Marland Kitchen docusate sodium (COLACE) 100 MG capsule Take 100 mg by mouth daily as needed for mild constipation.    . fluocinonide cream (LIDEX) 5.63 % Apply 1 application topically 2 (two) times daily as needed (psoriasis).    . fluticasone (FLONASE) 50 MCG/ACT nasal spray Place 2 sprays into both nostrils 2 (two) times daily.     . Fluticasone-Salmeterol (ADVAIR DISKUS) 250-50 MCG/DOSE AEPB Inhale 1 puff into the lungs 2 (two) times daily. 1 each 11  . levofloxacin (LEVAQUIN) 250 MG tablet Take 1 tablet (250 mg total) by mouth daily. 10 tablet 0  . loratadine (CLARITIN) 10 MG tablet Take 10 mg by mouth daily as needed for allergies.     . Multiple Vitamins-Minerals (ICAPS AREDS FORMULA PO) Take by mouth daily.    . nitroGLYCERIN (NITROSTAT) 0.4 MG SL tablet Place 1 tablet (0.4 mg  total) under the tongue every 5 (five) minutes as needed for chest pain. 25 tablet 3  . Polyethyl Glycol-Propyl Glycol (SYSTANE OP) Place 1 drop into both eyes daily as needed (dry eye).    . SPIRIVA HANDIHALER 18 MCG inhalation capsule PLACE 1 CAPSULE (18 MCG TOTAL) INTO INHALER AND INHALE DAILY. 30 capsule 11   No current facility-administered medications on file prior to visit.

## 2014-09-06 NOTE — Telephone Encounter (Signed)
Spoke with the pt and notified of recs per MW  He verbalized understanding  Rx was printed and faxed to his pharm  Nothing further needed

## 2014-09-10 DIAGNOSIS — R918 Other nonspecific abnormal finding of lung field: Secondary | ICD-10-CM

## 2014-09-10 HISTORY — DX: Other nonspecific abnormal finding of lung field: R91.8

## 2014-09-17 ENCOUNTER — Ambulatory Visit (HOSPITAL_COMMUNITY)
Admission: RE | Admit: 2014-09-17 | Discharge: 2014-09-17 | Disposition: A | Discharge status: Home / Self Care | Payer: Medicare Other | Source: Ambulatory Visit | Attending: Internal Medicine | Admitting: Internal Medicine

## 2014-09-17 DIAGNOSIS — R918 Other nonspecific abnormal finding of lung field: Secondary | ICD-10-CM | POA: Insufficient documentation

## 2014-09-17 DIAGNOSIS — Z79899 Other long term (current) drug therapy: Secondary | ICD-10-CM | POA: Diagnosis not present

## 2014-09-17 LAB — GLUCOSE, CAPILLARY: GLUCOSE-CAPILLARY: 103 mg/dL — AB (ref 70–99)

## 2014-09-17 MED ORDER — FLUDEOXYGLUCOSE F - 18 (FDG) INJECTION
6.4000 | Freq: Once | INTRAVENOUS | Status: AC | PRN
  Administered 2014-09-17: 6.4 via INTRAVENOUS

## 2014-09-20 ENCOUNTER — Other Ambulatory Visit: Payer: Self-pay | Admitting: Internal Medicine

## 2014-09-20 DIAGNOSIS — R918 Other nonspecific abnormal finding of lung field: Secondary | ICD-10-CM

## 2014-09-21 ENCOUNTER — Telehealth: Payer: Self-pay | Admitting: Internal Medicine

## 2014-09-21 MED ORDER — PREDNISONE 10 MG PO TABS: ORAL_TABLET | ORAL | Status: DC

## 2014-09-21 MED ORDER — ACETAMINOPHEN-CODEINE #3 300-30 MG PO TABS: 1.0000 | ORAL_TABLET | ORAL | Status: AC | PRN

## 2014-09-21 NOTE — Telephone Encounter (Signed)
Stop the inhalers and just use the tylenol #3 and give him #60 Plus Prednisone 10 mg take  4 each am x 2 days,   2 each am x 2 days,  1 each am x 2 days and stop

## 2014-09-21 NOTE — Telephone Encounter (Signed)
Pt requesting refill of acetaminophen-codeine (TYLENOL #3) 300-30 MG per tablet  Pt c/o nausea, loss of appetite and low energy levels. Pt states that she is getting weaker everyday. Pt wants to know if his lung disease could be causing these symptoms.   Pt states that inhalers do not seem to be helping with his cough and SOB. States that when he uses them he seems to cough them medication right back out. Inhalers seem to make cough worse.   Please advise Dr Melvyn Novas. Thanks.  No Known Allergies

## 2014-09-21 NOTE — Telephone Encounter (Signed)
Spoke with the pt and notified of recs per MW  He verbalized understanding  Nothing further needed  

## 2014-09-22 ENCOUNTER — Telehealth: Payer: Self-pay | Admitting: Internal Medicine

## 2014-09-22 ENCOUNTER — Other Ambulatory Visit: Payer: Self-pay | Admitting: Radiology

## 2014-09-22 NOTE — Telephone Encounter (Signed)
Yes but it's not a bronch, it's a CT direct IR bx

## 2014-09-22 NOTE — Telephone Encounter (Signed)
Called and spoke with pt and he is aware of MW recs.  He stated that they told him not to take anything prior to his procedure.  i advised the pt that if they told him not to take anything po prior to his procedure then he will need to wait until after to take his meds.  Pt voiced his understanding and nothing further is needed.

## 2014-09-22 NOTE — Telephone Encounter (Signed)
MW - please advise if okay to have pt take his medications before bronch tomorrow. Thanks!

## 2014-09-23 ENCOUNTER — Telehealth: Payer: Self-pay | Admitting: Internal Medicine

## 2014-09-23 ENCOUNTER — Ambulatory Visit (HOSPITAL_COMMUNITY)
Admission: RE | Admit: 2014-09-23 | Discharge: 2014-09-23 | Disposition: A | Discharge status: Home / Self Care | Payer: Medicare Other | Source: Ambulatory Visit | Attending: Internal Medicine | Admitting: Internal Medicine

## 2014-09-23 ENCOUNTER — Ambulatory Visit (HOSPITAL_COMMUNITY)
Admission: RE | Admit: 2014-09-23 | Discharge: 2014-09-23 | Disposition: A | Discharge status: Home / Self Care | Payer: Medicare Other | Source: Ambulatory Visit | Attending: Interventional Radiology | Admitting: Interventional Radiology

## 2014-09-23 DIAGNOSIS — R05 Cough: Secondary | ICD-10-CM | POA: Insufficient documentation

## 2014-09-23 DIAGNOSIS — Z720 Tobacco use: Secondary | ICD-10-CM | POA: Insufficient documentation

## 2014-09-23 DIAGNOSIS — R079 Chest pain, unspecified: Secondary | ICD-10-CM | POA: Insufficient documentation

## 2014-09-23 DIAGNOSIS — R911 Solitary pulmonary nodule: Secondary | ICD-10-CM

## 2014-09-23 DIAGNOSIS — R918 Other nonspecific abnormal finding of lung field: Secondary | ICD-10-CM

## 2014-09-23 LAB — CBC
HEMATOCRIT: 37.6 % — AB (ref 39.0–52.0)
Hemoglobin: 13.1 g/dL (ref 13.0–17.0)
MCH: 32.2 pg (ref 26.0–34.0)
MCHC: 34.8 g/dL (ref 30.0–36.0)
MCV: 92.4 fL (ref 78.0–100.0)
PLATELETS: 300 10*3/uL (ref 150–400)
RBC: 4.07 MIL/uL — ABNORMAL LOW (ref 4.22–5.81)
RDW: 14.1 % (ref 11.5–15.5)
WBC: 9.6 10*3/uL (ref 4.0–10.5)

## 2014-09-23 LAB — PROTIME-INR
INR: 1.27 (ref 0.00–1.49)
PROTHROMBIN TIME: 16.1 s — AB (ref 11.6–15.2)

## 2014-09-23 LAB — APTT: aPTT: 31 seconds (ref 24–37)

## 2014-09-23 MED ORDER — FENTANYL CITRATE 0.05 MG/ML IJ SOLN
INTRAMUSCULAR | Status: AC
  Filled 2014-09-23: qty 2

## 2014-09-23 MED ORDER — FENTANYL CITRATE 0.05 MG/ML IJ SOLN
INTRAMUSCULAR | Status: AC | PRN
  Administered 2014-09-23: 12.5 ug via INTRAVENOUS

## 2014-09-23 MED ORDER — MIDAZOLAM HCL 2 MG/2ML IJ SOLN
INTRAMUSCULAR | Status: AC
  Filled 2014-09-23: qty 2

## 2014-09-23 MED ORDER — MIDAZOLAM HCL 2 MG/2ML IJ SOLN
INTRAMUSCULAR | Status: AC | PRN
  Administered 2014-09-23: 0.5 mg via INTRAVENOUS

## 2014-09-23 MED ORDER — SODIUM CHLORIDE 0.9 % IV SOLN
INTRAVENOUS | Status: AC | PRN
  Administered 2014-09-23: 10 mL/h via INTRAVENOUS

## 2014-09-23 MED ORDER — HYDROCODONE-ACETAMINOPHEN 5-325 MG PO TABS: 1.0000 | ORAL_TABLET | ORAL | Status: DC | PRN

## 2014-09-23 MED ORDER — SODIUM CHLORIDE 0.9 % IV SOLN
Freq: Once | INTRAVENOUS | Status: AC
  Administered 2014-09-23: 08:00:00 via INTRAVENOUS

## 2014-09-23 MED ORDER — LIDOCAINE HCL 1 % IJ SOLN
INTRAMUSCULAR | Status: AC
  Filled 2014-09-23: qty 20

## 2014-09-23 NOTE — Procedures (Signed)
CT core bx LLL 18g x4 to surg path No complication No blood loss. See complete dictation in [facility].

## 2014-09-23 NOTE — Sedation Documentation (Signed)
Band aid to left back, C/D/I at this time.

## 2014-09-23 NOTE — Telephone Encounter (Signed)
Pt wanted to make sure that he was ok to continue taking his prednisone today after his bx.    Elie Confer, CMA at 09/22/2014 1:50 PM     Status: Signed       Expand All Collapse All   Called and spoke with pt and he is aware of MW recs. He stated that they told him not to take anything prior to his procedure. i advised the pt that if they told him not to take anything po prior to his procedure then he will need to wait until after to take his meds. Pt voiced his understanding and nothing further is needed.      Re-relayed this to pt.  Nothing further needed.

## 2014-09-23 NOTE — Discharge Instructions (Signed)
Needle Biopsy of Lung, Care After °Refer to this sheet in the next few weeks. These instructions provide you with information on caring for yourself after your procedure. Your health care provider may also give you more specific instructions. Your treatment has been planned according to current medical practices, but problems sometimes occur. Call your health care provider if you have any problems or questions after your procedure. °WHAT TO EXPECT AFTER THE PROCEDURE °· A bandage will be applied over the area where the needle was inserted. You may be asked to apply pressure to the bandage for several minutes to ensure there is minimal bleeding. °· In most cases, you can leave when your needle biopsy procedure is completed. Do not drive yourself home. Someone else should take you home. °· If you received an IV sedative or general anesthetic, you will be taken to a comfortable place to relax while the medicine wears off. °· If you have upcoming travel scheduled, talk to your health care provider about when it is safe to travel by air after the procedure. °HOME CARE INSTRUCTIONS °· Expect to take it easy for the rest of the day. °· Protect the area where you received the needle biopsy by keeping the bandage in place for as long as instructed. °· You may feel some mild pain or discomfort in the area, but this should stop in a day or two. °· Take medicines only as directed by your health care provider. °SEEK MEDICAL CARE IF:  °· You have pain at the biopsy site that worsens or is not helped by medicine. °· You have swelling or drainage at the needle biopsy site. °· You have a fever. °SEEK IMMEDIATE MEDICAL CARE IF:  °· You have new or worsening shortness of breath. °· You have chest pain. °· You are coughing up blood. °· You have bleeding that does not stop with pressure or a bandage. °· You develop light-headedness or fainting. °Document Released: 06/24/2007 Document Revised: 01/11/2014 Document Reviewed:  01/19/2013 °[employer]® Patient Information ©2015 [employer]. This information is not intended to replace advice given to you by your health care provider. Make sure you discuss any questions you have with your health care provider. ° °

## 2014-09-23 NOTE — Progress Notes (Signed)
Client with bloody sputum and Stacie Glaze notified and in to see client

## 2014-09-23 NOTE — H&P (Signed)
Chief Complaint: "I'm having a biopsy"  Referring Physician(s): Wert,Michael B  History of Present Illness: Jon Hicks is a 81 y.o. male, prior smoker, with history of COPD, CAD, weight loss, anorexia, cough, dyspnea, occ chest discomfort and recent imaging revealing mildly hypermetabolic pulmonary nodules/pancreatic tail region/left parotid gland. Pt had bronchoscopy with LLL lung bx 08/31/14 revealing atypical glandular cells. He presents today for CT guided left lung mass biopsy.  Past Medical History  Diagnosis Date  . DJD (degenerative joint disease)     (no psoriatic arthritis per pt.); hx of left knee cort[facility]one at [facility]  . Prostatitis     hx  . Allergic rhinitis   . ALLERGIC RHINITIS 11/22/2009  . COPD 11/22/2009  . GERD 11/22/2009  . PSORIASIS 11/22/2009  . Vitamin D deficiency 12/01/2010  . CAD (coronary artery disease)     a. s/p DES distal RCA, 20-30% sten in dist L main,  50-60% sten LAD,  40-50% sten prox vessel, 75-80% sten D1, 30-40% sten LCx, EF 55-60%  . Tobacco abuse     Past Surgical History  Procedure Laterality Date  . Cholecystectomy    . Edg      Dr. Miguel Rota esophageal nodule-normal tissue, 2000  . Cardiolite      normal exercise cardiolite 1999  . Urtheral      s/p urtheral stricture dilation-per urology-Dr. Hunt 1984  . Herniorrhapy      inguinal herniorrhapy-bilat  . Left heart catheterization with coronary angiogram N/A 07/27/2013    Procedure: LEFT HEART CATHETERIZATION WITH CORONARY ANGIOGRAM;  Surgeon: Blane Ohara, MD;  Location: [facility] CATH LAB;  Service: Cardiovascular;  Laterality: N/A;  . Video bronchoscopy Bilateral 08/31/2014    Procedure: VIDEO BRONCHOSCOPY WITH FLUORO;  Surgeon: Tanda Rockers, MD;  Location: [facility] ENDOSCOPY;  Service: Cardiopulmonary;  Laterality: Bilateral;    Allergies: Review of patient's allergies indicates no known allergies.  Medications: Prior to Admission medications   Medication Sig Start  Date End Date Taking? Authorizing Provider  acetaminophen-codeine (TYLENOL #3) 300-30 MG per tablet Take 1 tablet by mouth every 4 (four) hours as needed. 09/21/14  Yes Tanda Rockers, MD  albuterol (PROVENTIL HFA;VENTOLIN HFA) 108 (90 BASE) MCG/ACT inhaler Inhale 2 puffs into the lungs every 6 (six) hours as needed for wheezing or shortness of breath. 08/18/14  Yes Biagio Borg, MD  atorvastatin (LIPITOR) 10 MG tablet TAKE 1 TABLET  BY MOUTH DAILY AT 6 PM. 01/01/14  Yes Blane Ohara, MD  cholecalciferol (VITAMIN D) 400 UNITS TABS tablet Take 2,000 Units by mouth daily.    Yes Historical Provider, MD  clobetasol (TEMOVATE) 0.05 % cream Apply 1 application topically daily as needed (psoriasis).    Yes Historical Provider, MD  desonide (DESOWEN) 0.05 % cream Apply 1 application topically 2 (two) times daily as needed (rash).    Yes Historical Provider, MD  docusate sodium (COLACE) 100 MG capsule Take 100 mg by mouth daily as needed for mild constipation.   Yes Historical Provider, MD  fluocinonide cream (LIDEX) 2.67 % Apply 1 application topically 2 (two) times daily as needed (psoriasis).   Yes Historical Provider, MD  fluticasone (FLONASE) 50 MCG/ACT nasal spray Place 2 sprays into both nostrils 2 (two) times daily.  06/29/14  Yes Historical Provider, MD  Fluticasone-Salmeterol (ADVAIR DISKUS) 250-50 MCG/DOSE AEPB Inhale 1 puff into the lungs 2 (two) times daily. 08/18/14  Yes Biagio Borg, MD  loratadine (CLARITIN) 10 MG tablet Take 10  mg by mouth daily as needed for allergies.    Yes Historical Provider, MD  Multiple Vitamins-Minerals (ICAPS AREDS FORMULA PO) Take by mouth daily.   Yes Historical Provider, MD  nitroGLYCERIN (NITROSTAT) 0.4 MG SL tablet Place 1 tablet (0.4 mg total) under the tongue every 5 (five) minutes as needed for chest pain. 07/29/13  Yes Eileen Stanford, PA-C  Polyethyl Glycol-Propyl Glycol (SYSTANE OP) Place 1 drop into both eyes daily as needed (dry eye).   Yes Historical  Provider, MD  predniSONE (DELTASONE) 10 MG tablet 4 x 2 days, 2 x 2 days, 1 x 2 days, then stop 09/21/14  Yes Tanda Rockers, MD  SPIRIVA HANDIHALER 18 MCG inhalation capsule PLACE 1 CAPSULE (18 MCG TOTAL) INTO INHALER AND INHALE DAILY. 12/15/13  Yes Biagio Borg, MD  levofloxacin (LEVAQUIN) 250 MG tablet Take 1 tablet (250 mg total) by mouth daily. Patient not taking: Reported on 09/23/2014 08/18/14   Biagio Borg, MD    Family History  Problem Relation Age of Onset  . Arthritis Mother   . Heart disease Father     MI    History   Social History  . Marital Status: Married    Spouse Name: N/A    Number of Children: N/A  . Years of Education: N/A   Occupational History  . Retired     [place] Topics  . Smoking status: Current Some Day Smoker -- 30 years    Types: Cigars  . Smokeless tobacco: Never Used  . Alcohol Use: No  . Drug Use: No  . Sexual Activity: Not Currently   Other Topics Concern  . Not on file   Social History Narrative      Review of Systems   Constitutional: Positive for unexpected weight change. Negative for fever and chills.  Respiratory: Positive for cough and shortness of breath.   Cardiovascular:       Occ chest discomfort  Gastrointestinal: Positive for nausea and abdominal pain. Negative for vomiting and blood in stool.  Genitourinary: Negative for dysuria and hematuria.  Musculoskeletal: Negative for back pain.  Neurological: Negative for headaches.  Psychiatric/Behavioral: The patient is nervous/anxious.     Vital Signs: BP 143/63 mmHg  Pulse 68  Temp(Src) 97.6 F (36.4 C)  Resp 16  Ht 5\' 9"  (1.753 m)  Wt 130 lb (58.968 kg)  BMI 19.19 kg/m2  SpO2 94%  Physical Exam  Constitutional: He is oriented to person, place, and time.  Thin WM in NAD  Cardiovascular: Normal rate.   occ ectopy noted  Pulmonary/Chest: Effort normal.  distant BS with few bibasilar crackles  Abdominal: Soft. Bowel sounds are  normal. There is no tenderness.  Musculoskeletal: Normal range of motion. He exhibits no edema.  Neurological: He is alert and oriented to person, place, and time.    Imaging: Ct Chest W Contrast  08/25/2014   CLINICAL DATA:  Productive cough for a few months. Chest pain. Abnormal chest radiograph.  EXAM: CT CHEST WITH CONTRAST  TECHNIQUE: Multidetector CT imaging of the chest was performed during intravenous contrast administration.  CONTRAST:  54mL OMNIPAQUE IOHEXOL 300 MG/ML  SOLN  COMPARISON:  Chest radiograph 08/18/2014 and 07/27/2013.  FINDINGS: No pathologically enlarged mediastinal, hilar or axillary lymph nodes. Atherosclerotic calcification of the arterial vasculature, including three-vessel involvement of the coronary arteries. Heart is at the upper limits of normal in size. No pericardial effusion.  There is a basilar predominant pattern  of diffuse peribronchovascular and perifissural nodularity with conglomerate consolidation in both lower lobes. No pleural fluid. Airway is unremarkable.  Incidental imaging of the upper abdomen shows the visualized portions of the liver, adrenal glands, kidneys and spleen to be grossly unremarkable. Cholecystectomy. The pancreatic tail is somewhat atrophic and the duct measures minimally prominent at 4 mm. Question a small splenule along the tip of the pancreatic tail (image 59). Difficult to exclude a pancreatic mass. Visualized portions of the stomach and bowel are grossly unremarkable. No upper abdominal adenopathy.  No worrisome lytic or sclerotic lesions.  IMPRESSION: 1. Diffuse perilymphatic distribution of nodularity, basilar predominant, with conglomerate consolidation in both lower lobes. Findings are most consistent with metastatic disease. 2. Slight prominence of the pancreatic duct in the pancreatic tail. Pancreas is incompletely visualized. Possible splenule adjacent to the tip of the pancreatic tail. Difficult to exclude a small pancreatic mass. 3.  Three-vessel coronary artery calcification.   Electronically Signed   By: Lorin Picket M.D.   On: 08/25/2014 09:20   Nm Pet Image Initial (pi) Skull Base To Thigh  09/17/2014   CLINICAL DATA:  Initial treatment strategy for Chest CT demonstrating perilymphatic pulmonary nodules, most consistent with metastatic disease. No known history of primary malignancy.  EXAM: NUCLEAR MEDICINE PET SKULL BASE TO THIGH  TECHNIQUE: 6.4 mCi F-18 FDG was injected intravenously. Full-ring PET imaging was performed from the skull base to thigh after the radiotracer. CT data was obtained and used for attenuation correction and anatomic localization.  FASTING BLOOD GLUCOSE:  Value: 103 mg/dl  COMPARISON:  Chest CT of 08/25/2014.  FINDINGS: NECK  A focus of hypermetabolism about the central portion of the left parotid gland. This area is poorly evaluated on CT secondary to beam hardening artifact from dental hardware. This measures a S.U.V. max of 4.3, including on image 30 of series 4. Equivocal nodule in this area.  No cervical adenopathy.  CHEST  Low-level hypermetabolism which corresponds to lower lobe predominant pulmonary nodules and masses. Example dense right base masslike opacity which measures a S.U.V. max of 2.8, including on image 59 of series 6.  No abnormal activity within thoracic nodes.  ABDOMEN/PELVIS  Mild hypermetabolism corresponding to the subtle soft tissue fullness described on the prior CT within the pancreatic tail. This measures 2.0 x 1.7 cm and a S.U.V. max of 2.6 on image 126 of series 4. No abnormal nodal activity within the abdomen or pelvis.  SKELETON  No abnormal marrow activity.  CT IMAGES PERFORMED FOR ATTENUATION CORRECTION  Bilateral carotid atherosclerosis. Chest findings deferred to recent diagnostic CT. Atherosclerosis, including within the coronary arteries. Cholecystectomy. Pancreatic atrophy. Mildly prominent retroperitoneal nodes. None are pathologic by size criteria. Mild prostatomegaly.  Trace cul-de-sac fluid. Tiny fat containing right inguinal hernia. Moderate osteopenia.  IMPRESSION: 1. Relatively low-level hypermetabolism corresponding to innumerable pulmonary nodules and masses. Most consistent with metastatic disease. 2. Mild hypermetabolism corresponding to soft tissue fullness within the pancreatic tail. Considerations include primary pancreatic neoplasm (including possibly islet cell) or intrapancreatic splenule. Consider dedicated pre and post contrast abdominal MRI. 3. Left parotid hypermetabolism, suspicious for primary parotid neoplasm. This could be further evaluated with contrast-enhanced face/neck CT. This is unlikely to be the source of the widespread pulmonary metastasis.   Electronically Signed   By: Abigail Miyamoto M.D.   On: 09/17/2014 09:48   Dg Chest Port 1 View  08/31/2014   CLINICAL DATA:  Post bronchoscopy with biopsy LEFT lower lobe ; history smoking, coronary artery disease,  COPD, GERD  EXAM: PORTABLE CHEST - 1 VIEW  COMPARISON:  Portable exam 0903 hr compared to 08/18/2014 ; correlation: interval CT chest 08/25/2014  FINDINGS: Stable heart size and mediastinal contours.  Atherosclerotic calcification aorta.  Diffuse nodularity and infiltrate throughout both lungs, in combination with preceding CT exam corresponding to a combination of infiltrate and numerous small pulmonary nodules.  Mild respiratory motion artifacts LEFT lung.  No pneumothorax or gross pleural effusion identified.  Bones diffusely demineralized.  IMPRESSION: Diffuse nodularity in infiltrate throughout both lungs compatible with a combination of numerous pulmonary nodules and diffuse infiltrates when correlated with prior CT chest.  Overall appearance little changed when compared to the previous exam when accounting for differences in technique.  No pneumothorax.   Electronically Signed   By: Lavonia Dana M.D.   On: 08/31/2014 09:38   Dg C-arm Bronchoscopy  08/31/2014   CLINICAL DATA:    C-ARM  BRONCHOSCOPY  Fluoroscopy was utilized by the requesting physician.  No radiographic  interpretation.     Labs:  CBC:  Recent Labs  01/27/14 1429 08/18/14 1547  WBC 6.5 8.9  HGB 12.0* 13.6  HCT 35.9* 41.3  PLT 188 250.0    COAGS: No results for input(s): INR, APTT in the last 8760 hours.  BMP:  Recent Labs  01/27/14 1429 08/18/14 1547  NA 140 133*  K 4.2 4.3  CL  --  97  CO2 25 28  GLUCOSE 120 102*  BUN 12.0 16  CALCIUM 8.6 8.6  CREATININE 0.8 0.7    LIVER FUNCTION TESTS:  Recent Labs  01/27/14 1429 06/29/14 0758 08/18/14 1547  BILITOT 0.48 0.8 0.5  AST 19 20 28   ALT 14 17 24   ALKPHOS 115 116 155*  PROT 6.7 7.4 7.2  ALBUMIN 3.2* 3.1* 3.4*   Results for orders placed or performed during the hospital encounter of 09/23/14  CBC upon arrival  Result Value Ref Range   WBC 9.6 4.0 - 10.5 K/uL   RBC 4.07 (L) 4.22 - 5.81 MIL/uL   Hemoglobin 13.1 13.0 - 17.0 g/dL   HCT 37.6 (L) 39.0 - 52.0 %   MCV 92.4 78.0 - 100.0 fL   MCH 32.2 26.0 - 34.0 pg   MCHC 34.8 30.0 - 36.0 g/dL   RDW 14.1 11.5 - 15.5 %   Platelets 300 150 - 400 K/uL   09/23/14 PT/PTT pending  TUMOR MARKERS: No results for input(s): AFPTM, CEA, CA199, CHROMGRNA in the last 8760 hours.  Assessment and Plan: Jon Hicks is a 81 y.o. male, prior smoker, with history of COPD, CAD, weight loss, anorexia, cough, dyspnea, occ chest discomfort and recent imaging revealing mildly hypermetabolic pulmonary nodules/pancreatic tail region/left parotid gland. Pt had bronchoscopy with LLL lung bx 08/31/14 revealing atypical glandular cells. He presents today for CT guided left lung mass biopsy.Details/risks of procedure d/w pt/son with their understanding and consent.      Signed: Autumn Messing 09/23/2014, 8:01 AM

## 2014-09-23 NOTE — Progress Notes (Signed)
Jon Hicks notified of continued bloody sputum and that it is less than earlier and per Lennette Bihari ok to d/c home

## 2014-09-23 NOTE — Sedation Documentation (Signed)
Patient denies pain and is resting comfortably.  

## 2014-09-24 ENCOUNTER — Telehealth: Payer: Self-pay | Admitting: Internal Medicine

## 2014-09-24 NOTE — Telephone Encounter (Signed)
Dr Avis Epley aware that I will forward message to MW and Magda Paganini today.

## 2014-09-24 NOTE — Progress Notes (Signed)
Patient ID: Jon Hicks, male   DOB: [birth year], 81 y.o.   MRN: [id number]   Lt lung mass biopsy 09/23/14 Developed minimal amt hemoptysis after procedure prior to discharge yesterday. Reassured symptom would decrease gradually and resolve.  Son calls now with report that hemoptysis is not completely gone Now looks like older/dark blood Occurs with most coughs but small amount. Cough and sputum production is common/normal for this pt. Pt is otherwise without sxs Denies shortness of breath/ pain  Son also shares that after bronchoscopy 1 mo ago---this same thing occurred and lasted 2-3 days.  Discussed with Dr Pascal Lux Reassured pt and son. Due to extent of lung disease-- we would expect probably 2-3 days of mild hemoptysis If continues or worsens---please call back or to ED  They have good understanding and appreciate help

## 2014-09-27 LAB — FUNGUS CULTURE W SMEAR
Fungal Smear: NONE SEEN
Special Requests: NORMAL

## 2014-09-27 NOTE — Telephone Encounter (Signed)
Discussed with pt, needs oncology eval asap - established already with Shadad at the oncology center

## 2014-09-27 NOTE — Telephone Encounter (Signed)
Noted Will sign off 

## 2014-09-28 ENCOUNTER — Telehealth: Payer: Self-pay | Admitting: Internal Medicine

## 2014-09-28 NOTE — Telephone Encounter (Signed)
Reviewed again details re dx and need to set up f/u with shadad asap

## 2014-09-28 NOTE — Telephone Encounter (Signed)
Spoke with patient-states he has further questions regarding dx that was given/explained to him yesterday by MW and would like to speak with him directly. Also, patient states he took 3 or tablets of Tylenol #3 last night and needs to know what to do or when to take another tablet today if needed from MW. I did explain the Rx sig to patient to make sure he understood how to take the medication.   MW please contact patient. Thanks.

## 2014-09-28 NOTE — Telephone Encounter (Signed)
Ok to take tylenol #3 up to 2 every 4 hours and call in more needed and call to check for appt with shadad asap re metastatic ca > already established

## 2014-09-28 NOTE — Telephone Encounter (Signed)
Pt in very uneasy with diagnosis and would like to speak directly to provider with specific questions.

## 2014-09-29 ENCOUNTER — Other Ambulatory Visit: Payer: Self-pay | Admitting: Oncology

## 2014-09-29 ENCOUNTER — Telehealth: Payer: Self-pay | Admitting: Oncology

## 2014-09-29 ENCOUNTER — Telehealth: Payer: Self-pay | Admitting: *Deleted

## 2014-09-29 ENCOUNTER — Telehealth: Payer: Self-pay | Admitting: Internal Medicine

## 2014-09-29 NOTE — Telephone Encounter (Signed)
As I had not heard back yet from Dr Hazeline Junker office I called back and was able to speak with Erline Levine who was working with Dr Alen Blew today. She is aware that patient is needing an asap appt and will speak with Dr Alen Blew and take over from here. Their office will contact patient with appt date and time. Also, Erline Levine is aware that patient expressed to me he is losing weight without trying, feels like he is unable to eat and/or drink.   Pt is aware that Dr Hazeline Junker office will contact him. Nothing more needed at this time.

## 2014-09-29 NOTE — Telephone Encounter (Signed)
Patient called asking if there  Is something he may have to help breathing until seen on 10-05-2014.  "I have COPD and now have a tumor in my lungs.  Dr. Lenard Forth says everything is up to Dr. Alen Blew.  The medicines I'm taking aren't working.  My mouth is dry, lips are dry, nausea, constipation the last 4-5 days."  Reports feeling short of breath with any activity and especially in the mornings.  "When I sit and am relaxed there's more control."  Using Pro-air throughout the day.  Is not using the Advair or Spiriva regularly.   Reviewed instructions for inhalers.  Teach back method used and patient able to express use of his three inhalers.  Says he is using acetaminophen-codeine tablets for cough.  This nurse asked if he's tried any OTC cough liquids.  He will try Robitussin DM if needed.  Waited for him to check and his Temp = 98.6 at time of call.  Denies any blue discoloration to lips, face or fingertips.  Advised that he go to the ER if this happens.  Asked what to try to help bowels because the colace is not working.  Advised he try something OTC for bowels that's a laxative not just a softener.  Bowels were hard when they moved last.  He will try a laxative and increase fluids.

## 2014-09-29 NOTE — Telephone Encounter (Signed)
s.w. pt and advised on 1.26 appt per MD...pt ok and aware

## 2014-09-29 NOTE — Telephone Encounter (Signed)
I called Dr Hazeline Junker office and had to leave a message for them to contact me to get patient seen ASAP-pt has appt in May 2016 but that's too long to wait. Pt is aware that we are working on getting him seen asap.

## 2014-10-01 ENCOUNTER — Emergency Department (HOSPITAL_COMMUNITY): Payer: Medicare Other

## 2014-10-01 ENCOUNTER — Inpatient Hospital Stay (HOSPITAL_COMMUNITY): Payer: Medicare Other

## 2014-10-01 ENCOUNTER — Encounter (HOSPITAL_COMMUNITY): Payer: Self-pay | Admitting: *Deleted

## 2014-10-01 ENCOUNTER — Inpatient Hospital Stay (HOSPITAL_COMMUNITY)
Admission: EM | Admit: 2014-10-01 | Discharge: 2014-10-04 | DRG: 871 | Disposition: A | Discharge status: Home Health | Payer: Medicare Other | Attending: Internal Medicine | Admitting: Internal Medicine

## 2014-10-01 DIAGNOSIS — R0602 Shortness of breath: Secondary | ICD-10-CM

## 2014-10-01 DIAGNOSIS — R042 Hemoptysis: Secondary | ICD-10-CM

## 2014-10-01 DIAGNOSIS — E43 Unspecified severe protein-calorie malnutrition: Secondary | ICD-10-CM | POA: Diagnosis present

## 2014-10-01 DIAGNOSIS — Z681 Body mass index (BMI) 19 or less, adult: Secondary | ICD-10-CM | POA: Diagnosis not present

## 2014-10-01 DIAGNOSIS — C7800 Secondary malignant neoplasm of unspecified lung: Secondary | ICD-10-CM

## 2014-10-01 DIAGNOSIS — F17210 Nicotine dependence, cigarettes, uncomplicated: Secondary | ICD-10-CM | POA: Diagnosis present

## 2014-10-01 DIAGNOSIS — J189 Pneumonia, unspecified organism: Secondary | ICD-10-CM | POA: Diagnosis present

## 2014-10-01 DIAGNOSIS — R1310 Dysphagia, unspecified: Secondary | ICD-10-CM | POA: Diagnosis present

## 2014-10-01 DIAGNOSIS — K5900 Constipation, unspecified: Secondary | ICD-10-CM | POA: Diagnosis present

## 2014-10-01 DIAGNOSIS — K5909 Other constipation: Secondary | ICD-10-CM | POA: Diagnosis present

## 2014-10-01 DIAGNOSIS — J9601 Acute respiratory failure with hypoxia: Secondary | ICD-10-CM | POA: Diagnosis present

## 2014-10-01 DIAGNOSIS — J449 Chronic obstructive pulmonary disease, unspecified: Secondary | ICD-10-CM | POA: Diagnosis present

## 2014-10-01 DIAGNOSIS — A419 Sepsis, unspecified organism: Secondary | ICD-10-CM | POA: Diagnosis not present

## 2014-10-01 DIAGNOSIS — E559 Vitamin D deficiency, unspecified: Secondary | ICD-10-CM | POA: Diagnosis present

## 2014-10-01 DIAGNOSIS — R079 Chest pain, unspecified: Secondary | ICD-10-CM

## 2014-10-01 DIAGNOSIS — I2510 Atherosclerotic heart disease of native coronary artery without angina pectoris: Secondary | ICD-10-CM | POA: Diagnosis present

## 2014-10-01 DIAGNOSIS — L409 Psoriasis, unspecified: Secondary | ICD-10-CM | POA: Diagnosis present

## 2014-10-01 DIAGNOSIS — R64 Cachexia: Secondary | ICD-10-CM | POA: Diagnosis present

## 2014-10-01 DIAGNOSIS — M1990 Unspecified osteoarthritis, unspecified site: Secondary | ICD-10-CM | POA: Diagnosis present

## 2014-10-01 DIAGNOSIS — J9621 Acute and chronic respiratory failure with hypoxia: Secondary | ICD-10-CM

## 2014-10-01 DIAGNOSIS — C801 Malignant (primary) neoplasm, unspecified: Secondary | ICD-10-CM | POA: Diagnosis present

## 2014-10-01 DIAGNOSIS — K219 Gastro-esophageal reflux disease without esophagitis: Secondary | ICD-10-CM | POA: Diagnosis present

## 2014-10-01 HISTORY — DX: Other nonspecific abnormal finding of lung field: R91.8

## 2014-10-01 HISTORY — DX: Malignant (primary) neoplasm, unspecified: C80.1

## 2014-10-01 LAB — COMPREHENSIVE METABOLIC PANEL
ALBUMIN: 2.4 g/dL — AB (ref 3.5–5.2)
ALT: 28 U/L (ref 0–53)
AST: 34 U/L (ref 0–37)
Alkaline Phosphatase: 205 U/L — ABNORMAL HIGH (ref 39–117)
Anion gap: 10 (ref 5–15)
BUN: 8 mg/dL (ref 6–23)
CO2: 26 mmol/L (ref 19–32)
CREATININE: 0.71 mg/dL (ref 0.50–1.35)
Calcium: 8.3 mg/dL — ABNORMAL LOW (ref 8.4–10.5)
Chloride: 94 mEq/L — ABNORMAL LOW (ref 96–112)
GFR calc Af Amer: >90 mL/min (ref >90)
GFR calc non Af Amer: 84 mL/min — ABNORMAL LOW (ref >90)
Glucose, Bld: 133 mg/dL — ABNORMAL HIGH (ref 70–99)
Potassium: 3.8 mmol/L (ref 3.5–5.1)
Sodium: 130 mmol/L — ABNORMAL LOW (ref 135–145)
TOTAL PROTEIN: 6.1 g/dL (ref 6.0–8.3)
Total Bilirubin: 1.7 mg/dL — ABNORMAL HIGH (ref 0.3–1.2)

## 2014-10-01 LAB — CBC WITH DIFFERENTIAL/PLATELET
Basophils Absolute: 0 10*3/uL (ref 0.0–0.1)
Basophils Relative: 0 % (ref 0–1)
EOS ABS: 0 10*3/uL (ref 0.0–0.7)
Eosinophils Relative: 0 % (ref 0–5)
HCT: 34.7 % — ABNORMAL LOW (ref 39.0–52.0)
Hemoglobin: 12 g/dL — ABNORMAL LOW (ref 13.0–17.0)
LYMPHS PCT: 9 % — AB (ref 12–46)
Lymphs Abs: 1 10*3/uL (ref 0.7–4.0)
MCH: 31.5 pg (ref 26.0–34.0)
MCHC: 34.6 g/dL (ref 30.0–36.0)
MCV: 91.1 fL (ref 78.0–100.0)
MONOS PCT: 13 % — AB (ref 3–12)
Monocytes Absolute: 1.5 10*3/uL — ABNORMAL HIGH (ref 0.1–1.0)
Neutro Abs: 8.9 10*3/uL — ABNORMAL HIGH (ref 1.7–7.7)
Neutrophils Relative %: 78 % — ABNORMAL HIGH (ref 43–77)
Platelets: 264 10*3/uL (ref 150–400)
RBC: 3.81 MIL/uL — ABNORMAL LOW (ref 4.22–5.81)
RDW: 14.2 % (ref 11.5–15.5)
WBC: 11.4 10*3/uL — ABNORMAL HIGH (ref 4.0–10.5)

## 2014-10-01 LAB — TROPONIN I
TROPONIN I: 0.04 ng/mL — AB (ref <0.031)
TROPONIN I: 0.03 ng/mL (ref <0.031)
Troponin I: <0.03 ng/mL (ref <0.031)
Troponin I: 0.03 ng/mL (ref <0.031)

## 2014-10-01 LAB — BRAIN NATRIURETIC PEPTIDE: B Natriuretic Peptide: 110.5 pg/mL — ABNORMAL HIGH (ref 0.0–100.0)

## 2014-10-01 MED ORDER — CLOBETASOL PROPIONATE 0.05 % EX CREA
1.0000 "application " | TOPICAL_CREAM | Freq: Every day | CUTANEOUS | Status: DC | PRN
  Filled 2014-10-01: qty 15

## 2014-10-01 MED ORDER — FLUTICASONE PROPIONATE 50 MCG/ACT NA SUSP
2.0000 | Freq: Two times a day (BID) | NASAL | Status: DC | PRN
  Filled 2014-10-01: qty 16

## 2014-10-01 MED ORDER — IOHEXOL 300 MG/ML  SOLN
25.0000 mL | Freq: Once | INTRAMUSCULAR | Status: AC | PRN
  Administered 2014-10-01: 25 mL via ORAL

## 2014-10-01 MED ORDER — SODIUM CHLORIDE 0.9 % IJ SOLN
3.0000 mL | Freq: Two times a day (BID) | INTRAMUSCULAR | Status: DC
  Administered 2014-10-01 – 2014-10-04 (×5): 3 mL via INTRAVENOUS
  Filled 2014-10-01: qty 3

## 2014-10-01 MED ORDER — TIOTROPIUM BROMIDE MONOHYDRATE 18 MCG IN CAPS
18.0000 ug | ORAL_CAPSULE | Freq: Every day | RESPIRATORY_TRACT | Status: DC
  Administered 2014-10-02: 18 ug via RESPIRATORY_TRACT
  Filled 2014-10-01: qty 5

## 2014-10-01 MED ORDER — MOMETASONE FURO-FORMOTEROL FUM 100-5 MCG/ACT IN AERO
2.0000 | INHALATION_SPRAY | Freq: Two times a day (BID) | RESPIRATORY_TRACT | Status: DC
  Administered 2014-10-01 – 2014-10-04 (×6): 2 via RESPIRATORY_TRACT
  Filled 2014-10-01 (×2): qty 8.8

## 2014-10-01 MED ORDER — HEPARIN SODIUM (PORCINE) 5000 UNIT/ML IJ SOLN
5000.0000 [IU] | Freq: Three times a day (TID) | INTRAMUSCULAR | Status: DC
  Administered 2014-10-01: 5000 [IU] via SUBCUTANEOUS
  Filled 2014-10-01: qty 1

## 2014-10-01 MED ORDER — NITROGLYCERIN 0.4 MG SL SUBL: 0.4000 mg | SUBLINGUAL_TABLET | SUBLINGUAL | Status: DC | PRN

## 2014-10-01 MED ORDER — ACETAMINOPHEN-CODEINE #3 300-30 MG PO TABS
1.0000 | ORAL_TABLET | ORAL | Status: DC | PRN
  Administered 2014-10-01: 1 via ORAL
  Filled 2014-10-01: qty 1

## 2014-10-01 MED ORDER — IOHEXOL 350 MG/ML SOLN
100.0000 mL | Freq: Once | INTRAVENOUS | Status: AC | PRN
  Administered 2014-10-01: 100 mL via INTRAVENOUS

## 2014-10-01 MED ORDER — VITAMIN D3 25 MCG (1000 UNIT) PO TABS
2000.0000 [IU] | ORAL_TABLET | Freq: Every day | ORAL | Status: DC
  Administered 2014-10-01 – 2014-10-04 (×4): 2000 [IU] via ORAL
  Filled 2014-10-01 (×4): qty 2

## 2014-10-01 MED ORDER — LORATADINE 10 MG PO TABS
10.0000 mg | ORAL_TABLET | Freq: Every day | ORAL | Status: DC | PRN
  Filled 2014-10-01: qty 1

## 2014-10-01 MED ORDER — ALBUTEROL SULFATE HFA 108 (90 BASE) MCG/ACT IN AERS: 2.0000 | INHALATION_SPRAY | Freq: Four times a day (QID) | RESPIRATORY_TRACT | Status: DC | PRN

## 2014-10-01 MED ORDER — ONDANSETRON HCL 4 MG/2ML IJ SOLN
4.0000 mg | Freq: Three times a day (TID) | INTRAMUSCULAR | Status: DC | PRN
  Administered 2014-10-01: 4 mg via INTRAVENOUS
  Filled 2014-10-01: qty 2

## 2014-10-01 MED ORDER — ENSURE COMPLETE PO LIQD
237.0000 mL | Freq: Two times a day (BID) | ORAL | Status: DC
  Administered 2014-10-01 – 2014-10-02 (×3): 237 mL via ORAL

## 2014-10-01 MED ORDER — ALBUTEROL SULFATE (2.5 MG/3ML) 0.083% IN NEBU: 2.5000 mg | INHALATION_SOLUTION | Freq: Four times a day (QID) | RESPIRATORY_TRACT | Status: DC | PRN

## 2014-10-01 MED ORDER — ATORVASTATIN CALCIUM 10 MG PO TABS
10.0000 mg | ORAL_TABLET | Freq: Every day | ORAL | Status: DC
  Administered 2014-10-01 – 2014-10-03 (×3): 10 mg via ORAL
  Filled 2014-10-01 (×4): qty 1

## 2014-10-01 MED ORDER — DOCUSATE SODIUM 100 MG PO CAPS
100.0000 mg | ORAL_CAPSULE | Freq: Every day | ORAL | Status: DC | PRN
  Filled 2014-10-01: qty 1

## 2014-10-01 MED ORDER — DM-GUAIFENESIN ER 30-600 MG PO TB12
1.0000 | ORAL_TABLET | Freq: Two times a day (BID) | ORAL | Status: DC | PRN
  Administered 2014-10-01: 1 via ORAL
  Filled 2014-10-01 (×3): qty 1

## 2014-10-01 MED ORDER — FLUOCINONIDE 0.05 % EX CREA
1.0000 "application " | TOPICAL_CREAM | Freq: Two times a day (BID) | CUTANEOUS | Status: DC | PRN
  Filled 2014-10-01: qty 30

## 2014-10-01 MED ORDER — CETYLPYRIDINIUM CHLORIDE 0.05 % MT LIQD
7.0000 mL | Freq: Two times a day (BID) | OROMUCOSAL | Status: DC
  Administered 2014-10-01 – 2014-10-04 (×6): 7 mL via OROMUCOSAL

## 2014-10-01 NOTE — ED Notes (Signed)
Pt reports being woken up around midnight with generalized chest pain. Unable to describe pain. Pt took one nitro at home with some relief. Recently had a lung biopsy for a tumor found in his lungs.

## 2014-10-01 NOTE — ED Notes (Signed)
Pt to ED from home c/o chest pain and increased shortness of breath. Pt had a lung biopsy over a week ago. Hx of COPD; reports coughing up blood. Pt took nitro x 1, decreased pain from 8/10 to 3/10. EMS gave 324mg  ASA. EMS VS 120/60; HR 80; spO2 initinally in the mid-80s; sats at 96% on 4L

## 2014-10-01 NOTE — ED Notes (Signed)
Pt son taking all belongings home.

## 2014-10-01 NOTE — Progress Notes (Signed)
[place] a call to MD re; nausea no vomiting . Blood tinged sputum remains .MD gave orders . Pt informed

## 2014-10-01 NOTE — Progress Notes (Signed)
INITIAL NUTRITION ASSESSMENT  DOCUMENTATION CODES Per approved criteria  -Severe malnutrition in the context of chronic illness -Underweight   Pt meets criteria of severe MALNUTRITION in the context of chronic illness as evidenced by >50% PO intake for the past 10 months, 8% wt loss in past month and severe depletion of fat and muscle mass.   INTERVENTION: -Continue Ensure Complete BID -Magic Cup Q24H -Recommend SLP consult due to pt report of difficulty swallowing -Continue to monitor PO intake  NUTRITION DIAGNOSIS: Inadequate oral intake related to >50% PO intake in the past 10 months as evidenced by pt diet recall and unintentional weight loss of 8% in the past month.   Goal: -Pt to meets >/= 90% of needs with meals and supplements  Monitor:  Pt PO intake, acceptance of supplements, weight and labs  Reason for Assessment: MST  81 y.o. 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  Recent cancer diagnosis, Hx of COPD, GERD, CAD.  Pt experiencing unintentional weight loss.    Pt states he has been loosing weight over the past year, usually weighs 135 lbs.  He has also had a decreased appetite over the past year, reports he eats about 50% less than usual.  Wife makes meals and he will eat a couple bites. Pt reports difficulty in swallowing.  Sometimes his throat is sore and sometimes food gets stuck.  He says he will just spit out his food when it gets stuck and give up on eating that meal.  Recommend SLP consult.  Pt ate only a few bites of breakfast this morning, although he finished his Ensure complete.  He wants to continue those BID and willing to try a Magic Cup as Q24H as well.  He really wants to gain his weight back.   Nutrition Focused Physical Exam:  Subcutaneous Fat:  Orbital Region: moderate depletion Upper Arm Region: severe depletion Thoracic and Lumbar Region: severe  depletion  Muscle:  Temple Region: severe depletion Clavicle Bone Region: severe depletion Clavicle and Acromion Bone Region: severe depletion Scapular Bone Region: severe depletion Dorsal Hand: severe depletion Patellar Region: severe depletion Anterior Thigh Region: severe depletion Posterior Calf Region: severe depletion  Edema: not present    Height: Ht Readings from Last 1 Encounters:  10/01/14 5\' 9"  (1.753 m)    Weight: Wt Readings from Last 1 Encounters:  10/01/14 119 lb 7.8 oz (54.2 kg)    Ideal Body Weight: 160  % Ideal Body Weight: 74%  Wt Readings from Last 10 Encounters:  10/01/14 119 lb 7.8 oz (54.2 kg)  08/25/14 130 lb 3.2 oz (59.058 kg)  08/18/14 127 lb (57.607 kg)  07/02/14 132 lb 12.8 oz (60.238 kg)  02/12/14 134 lb 4 oz (60.895 kg)  01/27/14 135 lb 3.2 oz (61.326 kg)  01/01/14 135 lb (61.236 kg)  09/15/13 132 lb 6.4 oz (60.056 kg)  08/20/13 131 lb 9.8 oz (59.7 kg)  08/14/13 133 lb 4 oz (60.442 kg)    Usual Body Weight: 135  % Usual Body Weight: 88%  BMI:  Body mass index is 17.64 kg/(m^2).  Estimated Nutritional Needs: Kcal: 1800-2000 Protein: 65-80g protein Fluid: >/= 1840mL/day  Skin: not documented  Diet Order: Diet regular  EDUCATION NEEDS: -no education needed at this time  No intake or output data in the 24 hours ending 10/01/14 1206  Last BM: not documented   Labs:   Recent Labs Lab 10/01/14  0445  NA 130*  K 3.8  CL 94*  CO2 26  BUN 8  CREATININE 0.71  CALCIUM 8.3*  GLUCOSE 133*    CBG (last 3)  No results for input(s): GLUCAP in the last 72 hours.  Scheduled Meds: . atorvastatin  10 mg Oral q1800  . cholecalciferol  2,000 Units Oral Daily  . feeding supplement (ENSURE COMPLETE)  237 mL Oral BID BM  . heparin  5,000 Units Subcutaneous 3 times per day  . mometasone-formoterol  2 puff Inhalation BID  . sodium chloride  3 mL Intravenous Q12H  . tiotropium  18 mcg Inhalation Daily    Continuous  Infusions:   Past Medical History  Diagnosis Date  . DJD (degenerative joint disease)     (no psoriatic arthritis per pt.); hx of left knee cortisone at [facility]  . Prostatitis     hx  . Allergic rhinitis   . ALLERGIC RHINITIS 11/22/2009  . COPD 11/22/2009  . GERD 11/22/2009  . PSORIASIS 11/22/2009  . Vitamin D deficiency 12/01/2010  . CAD (coronary artery disease)     a. s/p DES distal RCA, 20-30% sten in dist L main,  50-60% sten LAD,  40-50% sten prox vessel, 75-80% sten D1, 30-40% sten LCx, EF 55-60%  . Tobacco abuse   . Lung mass 09/2014  . Adenocarcinoma     LUNG    Past Surgical History  Procedure Laterality Date  . Cholecystectomy    . Edg      Dr. Miguel Rota esophageal nodule-normal tissue, 2000  . Cardiolite      normal exercise cardiolite 1999  . Urtheral      s/p urtheral stricture dilation-per urology-Dr. Hunt 1984  . Herniorrhapy      inguinal herniorrhapy-bilat  . Left heart catheterization with coronary angiogram N/A 07/27/2013    Procedure: LEFT HEART CATHETERIZATION WITH CORONARY ANGIOGRAM;  Surgeon: Blane Ohara, MD;  Location: [facility] CATH LAB;  Service: Cardiovascular;  Laterality: N/A;  . Video bronchoscopy Bilateral 08/31/2014    Procedure: VIDEO BRONCHOSCOPY WITH FLUORO;  Surgeon: Tanda Rockers, MD;  Location: [facility] ENDOSCOPY;  Service: Cardiopulmonary;  Laterality: Bilateral;  . Lung biopsy      Elmer Picker MS Dietetic Intern Pager Number [phone]

## 2014-10-01 NOTE — Progress Notes (Addendum)
[facility] PROGRESS NOTE  Jon Hicks FVW:[id number] DOB: July 14, 1928 DOA: 10/01/2014 PCP: Cathlean Cower, MD  Patient is seen examined, no distress, just admitted from ED,  -please see the full H&P for A&P of care  Due to hemoptysis, will hold SQ heparin; but will cont SCD [name]  [facility] Pager [phone]. If 7PM-7AM, please contact night-coverage at [email or web address], password [name] 10/01/2014, 9:38 AM  LOS: 0 days

## 2014-10-01 NOTE — ED Notes (Signed)
Dr. Gardner at bedside 

## 2014-10-01 NOTE — H&P (Signed)
[facility] History and Physical  Jon Hicks TIR:[id number] DOB: [birth year] DOA: 10/01/2014  Referring physician: EDP PCP: Cathlean Cower, MD   Chief Complaint: Chest pain, SOB   HPI: Jon Hicks is a 81 y.o. male who presents to the ED with chest discomfort around midnight.  Patient has had a week long history of SOB as well.  Increased cough with occasional small amount of bloody sputum.  He had biopsy of lung on 1/14 for lung mass which he has not received the results for yet but has an appointment scheduled with Dr. Alen Blew on Tuesday of next week.  In addition to the lung mass patient has had ongoing unintentional weight loss.  Patient tried NTG at home with improvement in CP.  ASA given en-route.  EMS noted initial O2 sat in the mid 80s, improved with O2 via [place].  Review of Systems: Systems reviewed.  As above, otherwise negative  Past Medical History  Diagnosis Date  . DJD (degenerative joint disease)     (no psoriatic arthritis per pt.); hx of left knee cortisone at [facility]  . Prostatitis     hx  . Allergic rhinitis   . ALLERGIC RHINITIS 11/22/2009  . COPD 11/22/2009  . GERD 11/22/2009  . PSORIASIS 11/22/2009  . Vitamin D deficiency 12/01/2010  . CAD (coronary artery disease)     a. s/p DES distal RCA, 20-30% sten in dist L main,  50-60% sten LAD,  40-50% sten prox vessel, 75-80% sten D1, 30-40% sten LCx, EF 55-60%  . Tobacco abuse    Past Surgical History  Procedure Laterality Date  . Cholecystectomy    . Edg      Dr. Miguel Rota esophageal nodule-normal tissue, 2000  . Cardiolite      normal exercise cardiolite 1999  . Urtheral      s/p urtheral stricture dilation-per urology-Dr. Hunt 1984  . Herniorrhapy      inguinal herniorrhapy-bilat  . Left heart catheterization with coronary angiogram N/A 07/27/2013    Procedure: LEFT HEART CATHETERIZATION WITH CORONARY ANGIOGRAM;  Surgeon: Blane Ohara, MD;  Location: [facility] CATH LAB;  Service:  Cardiovascular;  Laterality: N/A;  . Video bronchoscopy Bilateral 08/31/2014    Procedure: VIDEO BRONCHOSCOPY WITH FLUORO;  Surgeon: Tanda Rockers, MD;  Location: [facility] ENDOSCOPY;  Service: Cardiopulmonary;  Laterality: Bilateral;  . Lung biopsy     Social History:  reports that he has been smoking Cigars.  He has never used smokeless tobacco. He reports that he does not drink alcohol or use illicit drugs.  No Known Allergies  Family History  Problem Relation Age of Onset  . Arthritis Mother   . Heart disease Father     MI     Prior to Admission medications   Medication Sig Start Date End Date Taking? Authorizing Provider  acetaminophen-codeine (TYLENOL #3) 300-30 MG per tablet Take 1 tablet by mouth every 4 (four) hours as needed. 09/21/14   Tanda Rockers, MD  albuterol (PROVENTIL HFA;VENTOLIN HFA) 108 (90 BASE) MCG/ACT inhaler Inhale 2 puffs into the lungs every 6 (six) hours as needed for wheezing or shortness of breath. 08/18/14   Biagio Borg, MD  atorvastatin (LIPITOR) 10 MG tablet TAKE 1 TABLET  BY MOUTH DAILY AT 6 PM. 01/01/14   Blane Ohara, MD  cholecalciferol (VITAMIN D) 400 UNITS TABS tablet Take 2,000 Units by mouth daily.     Historical Provider, MD  clobetasol (TEMOVATE) 0.05 % cream Apply 1 application topically  daily as needed (psoriasis).     Historical Provider, MD  desonide (DESOWEN) 0.05 % cream Apply 1 application topically 2 (two) times daily as needed (rash).     Historical Provider, MD  docusate sodium (COLACE) 100 MG capsule Take 100 mg by mouth daily as needed for mild constipation.    Historical Provider, MD  fluocinonide cream (LIDEX) 9.56 % Apply 1 application topically 2 (two) times daily as needed (psoriasis).    Historical Provider, MD  fluticasone (FLONASE) 50 MCG/ACT nasal spray Place 2 sprays into both nostrils 2 (two) times daily.  06/29/14   Historical Provider, MD  Fluticasone-Salmeterol (ADVAIR DISKUS) 250-50 MCG/DOSE AEPB Inhale 1 puff into the  lungs 2 (two) times daily. 08/18/14   Biagio Borg, MD  levofloxacin (LEVAQUIN) 250 MG tablet Take 1 tablet (250 mg total) by mouth daily. Patient not taking: Reported on 09/23/2014 08/18/14   Biagio Borg, MD  loratadine (CLARITIN) 10 MG tablet Take 10 mg by mouth daily as needed for allergies.     Historical Provider, MD  Multiple Vitamins-Minerals (ICAPS AREDS FORMULA PO) Take by mouth daily.    Historical Provider, MD  nitroGLYCERIN (NITROSTAT) 0.4 MG SL tablet Place 1 tablet (0.4 mg total) under the tongue every 5 (five) minutes as needed for chest pain. 07/29/13   Eileen Stanford, PA-C  Polyethyl Glycol-Propyl Glycol (SYSTANE OP) Place 1 drop into both eyes daily as needed (dry eye).    Historical Provider, MD  predniSONE (DELTASONE) 10 MG tablet 4 x 2 days, 2 x 2 days, 1 x 2 days, then stop 09/21/14   Tanda Rockers, MD  SPIRIVA HANDIHALER 18 MCG inhalation capsule PLACE 1 CAPSULE (18 MCG TOTAL) INTO INHALER AND INHALE DAILY. 12/15/13   Biagio Borg, MD   Physical Exam: Filed Vitals:   10/01/14 0448  BP: 126/60  Pulse: 79  Temp: 98 F (36.7 C)  Resp: 28    BP 126/60 mmHg  Pulse 79  Temp(Src) 98 F (36.7 C) (Oral)  Resp 28  SpO2 92%  General Appearance:    Alert, oriented, no distress, appears stated age  Head:    Normocephalic, atraumatic  Eyes:    PERRL, EOMI, sclera non-icteric        Nose:   Nares without drainage or epistaxis. Mucosa, turbinates normal  Throat:   Moist mucous membranes. Oropharynx without erythema or exudate.  Neck:   Supple. No carotid bruits.  No thyromegaly.  No lymphadenopathy.   Back:     No CVA tenderness, no spinal tenderness  Lungs:     Clear to auscultation bilaterally, without wheezes, rhonchi or rales  Chest wall:    No tenderness to palpitation  Heart:    Regular rate and rhythm without murmurs, gallops, rubs  Abdomen:     Soft, non-tender, nondistended, normal bowel sounds, no organomegaly  Genitalia:    deferred  Rectal:    deferred   Extremities:   No clubbing, cyanosis or edema.  Pulses:   2+ and symmetric all extremities  Skin:   Skin color, texture, turgor normal, no rashes or lesions  Lymph nodes:   Cervical, supraclavicular, and axillary nodes normal  Neurologic:   CNII-XII intact. Normal strength, sensation and reflexes      throughout    Labs on Admission:  Basic Metabolic Panel:  Recent Labs Lab 10/01/14 0445  NA 130*  K 3.8  CL 94*  CO2 26  GLUCOSE 133*  BUN 8  CREATININE 0.71  CALCIUM 8.3*   Liver Function Tests:  Recent Labs Lab 10/01/14 0445  AST 34  ALT 28  ALKPHOS 205*  BILITOT 1.7*  PROT 6.1  ALBUMIN 2.4*   No results for input(s): LIPASE, AMYLASE in the last 168 hours. No results for input(s): AMMONIA in the last 168 hours. CBC:  Recent Labs Lab 10/01/14 0445  WBC 11.4*  NEUTROABS 8.9*  HGB 12.0*  HCT 34.7*  MCV 91.1  PLT 264   Cardiac Enzymes:  Recent Labs Lab 10/01/14 0445  TROPONINI <0.03    BNP (last 3 results) No results for input(s): PROBNP in the last 8760 hours. CBG: No results for input(s): GLUCAP in the last 168 hours.  Radiological Exams on Admission: Dg Chest Port 1 View  10/01/2014   CLINICAL DATA:  Shortness of breath  EXAM: PORTABLE CHEST - 1 VIEW  COMPARISON:  09/23/2014  FINDINGS: Innumerable pulmonary nodules creating confluent opacities in the lower lungs is unchanged. No superimposed edema or pneumonia is identified, although an additional pathology could be easily obscured. There is no evidence for pleural effusion or pneumothorax. No cardiomegaly. Visible aortic contours are negative.  IMPRESSION: Diffuse pulmonary nodularity, confluent at the bases, is unchanged from prior. No evidence of acute superimposed disease.   Electronically Signed   By: Jorje Guild M.D.   On: 10/01/2014 05:12    EKG: Independently reviewed.  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 Adenocarcinoma of unclear primary site with metastatic disease to lung - Unfortunately the patients biopsy results confirm that the lung mass is adenocarcinoma, likely an upper GI or pancreatobiliary primary.  This is also the most likely explanation of his SOB, hemoptysis, weight loss, and other symptoms. 1. Informed patient and family of biopsy results (and why we were doing the following tests and need to see Dr. Osker Mason on Tues). 2. Tele monitoring 3. Will check serial troponins but doubt ACS is to blame for symptoms 4. CT angio chest to rule out PE is ordered given findings of metastatic cancer. 5. CT abd/pelvis with contrast ordered to look at extent of disease and for primary tumor site. 2. Acute respiratory failure with hypoxia - secondary to #1 1. Improved with O2 via [place]    Code Status: Full code - did not feel that this was appropriate to further discuss with patient at this time as I had just informed him of the metastatic cancer diagnosis.  Family Communication: Son at bedside Disposition Plan: Admit to inpatient   Time spent: 70 min  GARDNER, JARED M. [facility] Pager [phone]  If 7AM-7PM, please contact the day team taking care of the patient [email or web address] Password [email or web address] 10/01/2014, 6:20 AM

## 2014-10-01 NOTE — Progress Notes (Addendum)
UR completed Milas Schappell K. Amery Vandenbos, RN, BSN, MSHL, CCM  10/01/2014 11:36 AM

## 2014-10-01 NOTE — Progress Notes (Signed)
0938 report received from [facility] ED 0820 Arrived from ED with o2 and cardiac monitor. Son came in with pt

## 2014-10-01 NOTE — ED Provider Notes (Signed)
CSN: [id number]     Arrival date & time 10/01/14  3662 History   First MD Initiated Contact with Patient 10/01/14 0434     Chief Complaint  Patient presents with  . Chest Pain  . Shortness of Breath     (Consider location/radiation/quality/duration/timing/severity/associated sxs/prior Treatment) HPI She presents with chest discomfort that started around midnight. This associated with greater than a week of ongoing shortness of breath. Patient has had increased cough and active of white sputum and occasional small drops of blood. Had biopsy of the lungs on 1/14 for lung mass. Patient denies any fevers or chills. On EMS arrival patient noted to have saturations in the mid 80s. Improved with supplemental oxygen. No lower extremity swelling or pain. Patient did take nitroglycerin at home with improvement of his chest pain. Also received aspirin en route. Past Medical History  Diagnosis Date  . DJD (degenerative joint disease)     (no psoriatic arthritis per pt.); hx of left knee cortisone at [facility]  . Prostatitis     hx  . Allergic rhinitis   . ALLERGIC RHINITIS 11/22/2009  . COPD 11/22/2009  . GERD 11/22/2009  . PSORIASIS 11/22/2009  . Vitamin D deficiency 12/01/2010  . CAD (coronary artery disease)     a. s/p DES distal RCA, 20-30% sten in dist L main,  50-60% sten LAD,  40-50% sten prox vessel, 75-80% sten D1, 30-40% sten LCx, EF 55-60%  . Tobacco abuse    Past Surgical History  Procedure Laterality Date  . Cholecystectomy    . Edg      Dr. Miguel Rota esophageal nodule-normal tissue, 2000  . Cardiolite      normal exercise cardiolite 1999  . Urtheral      s/p urtheral stricture dilation-per urology-Dr. Hunt 1984  . Herniorrhapy      inguinal herniorrhapy-bilat  . Left heart catheterization with coronary angiogram N/A 07/27/2013    Procedure: LEFT HEART CATHETERIZATION WITH CORONARY ANGIOGRAM;  Surgeon: Blane Ohara, MD;  Location: [facility] CATH LAB;  Service: Cardiovascular;   Laterality: N/A;  . Video bronchoscopy Bilateral 08/31/2014    Procedure: VIDEO BRONCHOSCOPY WITH FLUORO;  Surgeon: Tanda Rockers, MD;  Location: [facility] ENDOSCOPY;  Service: Cardiopulmonary;  Laterality: Bilateral;   Family History  Problem Relation Age of Onset  . Arthritis Mother   . Heart disease Father     MI   History  Substance Use Topics  . Smoking status: Current Some Day Smoker -- 30 years    Types: Cigars  . Smokeless tobacco: Never Used  . Alcohol Use: No    Review of Systems  Constitutional: Negative for fever and chills.  Respiratory: Positive for cough, shortness of breath and wheezing.   Cardiovascular: Positive for chest pain. Negative for palpitations and leg swelling.  Gastrointestinal: Negative for nausea, vomiting, abdominal pain and diarrhea.  Musculoskeletal: Negative for back pain, neck pain and neck stiffness.  Skin: Negative for rash and wound.  Neurological: Negative for dizziness, weakness, light-headedness, numbness and headaches.  All other systems reviewed and are negative.     Allergies  Review of patient's allergies indicates no known allergies.  Home Medications   Prior to Admission medications   Medication Sig Start Date End Date Taking? Authorizing Provider  acetaminophen-codeine (TYLENOL #3) 300-30 MG per tablet Take 1 tablet by mouth every 4 (four) hours as needed. 09/21/14   Tanda Rockers, MD  albuterol (PROVENTIL HFA;VENTOLIN HFA) 108 (90 BASE) MCG/ACT inhaler Inhale 2 puffs into  the lungs every 6 (six) hours as needed for wheezing or shortness of breath. 08/18/14   Biagio Borg, MD  atorvastatin (LIPITOR) 10 MG tablet TAKE 1 TABLET  BY MOUTH DAILY AT 6 PM. 01/01/14   Blane Ohara, MD  cholecalciferol (VITAMIN D) 400 UNITS TABS tablet Take 2,000 Units by mouth daily.     Historical Provider, MD  clobetasol (TEMOVATE) 0.05 % cream Apply 1 application topically daily as needed (psoriasis).     Historical Provider, MD  desonide (DESOWEN)  0.05 % cream Apply 1 application topically 2 (two) times daily as needed (rash).     Historical Provider, MD  docusate sodium (COLACE) 100 MG capsule Take 100 mg by mouth daily as needed for mild constipation.    Historical Provider, MD  fluocinonide cream (LIDEX) 6.72 % Apply 1 application topically 2 (two) times daily as needed (psoriasis).    Historical Provider, MD  fluticasone (FLONASE) 50 MCG/ACT nasal spray Place 2 sprays into both nostrils 2 (two) times daily.  06/29/14   Historical Provider, MD  Fluticasone-Salmeterol (ADVAIR DISKUS) 250-50 MCG/DOSE AEPB Inhale 1 puff into the lungs 2 (two) times daily. 08/18/14   Biagio Borg, MD  levofloxacin (LEVAQUIN) 250 MG tablet Take 1 tablet (250 mg total) by mouth daily. Patient not taking: Reported on 09/23/2014 08/18/14   Biagio Borg, MD  loratadine (CLARITIN) 10 MG tablet Take 10 mg by mouth daily as needed for allergies.     Historical Provider, MD  Multiple Vitamins-Minerals (ICAPS AREDS FORMULA PO) Take by mouth daily.    Historical Provider, MD  nitroGLYCERIN (NITROSTAT) 0.4 MG SL tablet Place 1 tablet (0.4 mg total) under the tongue every 5 (five) minutes as needed for chest pain. 07/29/13   Eileen Stanford, PA-C  Polyethyl Glycol-Propyl Glycol (SYSTANE OP) Place 1 drop into both eyes daily as needed (dry eye).    Historical Provider, MD  predniSONE (DELTASONE) 10 MG tablet 4 x 2 days, 2 x 2 days, 1 x 2 days, then stop 09/21/14   Tanda Rockers, MD  SPIRIVA HANDIHALER 18 MCG inhalation capsule PLACE 1 CAPSULE (18 MCG TOTAL) INTO INHALER AND INHALE DAILY. 12/15/13   Biagio Borg, MD   There were no vitals taken for this visit. Physical Exam  Constitutional: He is oriented to person, place, and time. He appears well-developed and well-nourished. No distress.  Cachectic.  HENT:  Head: Normocephalic and atraumatic.  Mouth/Throat: Oropharynx is clear and moist.  Eyes: EOM are normal. Pupils are equal, round, and reactive to light.  Neck:  Normal range of motion. Neck supple.  Cardiovascular: Normal rate and regular rhythm.   Pulmonary/Chest: Effort normal. No respiratory distress. He has wheezes (few scattered wheezes. Crackles in bilateral bases.). He has no rales. He exhibits no tenderness.  Abdominal: Soft. Bowel sounds are normal. He exhibits no distension and no mass. There is no tenderness. There is no rebound and no guarding.  Musculoskeletal: Normal range of motion. He exhibits no edema or tenderness.  No lower extremity swelling or pain.  Neurological: He is alert and oriented to person, place, and time.  Skin: Skin is warm and dry. No rash noted. No erythema.  Psychiatric: He has a normal mood and affect. His behavior is normal.  Nursing note and vitals reviewed.   ED Course  Procedures (including critical care time) Labs Review Labs Reviewed  CBC WITH DIFFERENTIAL  BRAIN NATRIURETIC PEPTIDE  COMPREHENSIVE METABOLIC PANEL  TROPONIN I  Imaging Review No results found.   EKG Interpretation None      MDM   Final diagnoses:  SOB (shortness of breath)    EKG without acute changes. Chest x-ray with no obvious pneumonia. Biopsy came back positive for adenocarcinoma. Discussed with hospitalist and we'll get CT angiogram of the chest as well as CT of the abdomen and pelvis and admit.    Julianne Rice, MD 10/01/14 [phone]

## 2014-10-01 NOTE — Care Management Note (Addendum)
    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   Status of service:  Completed, signed off Medicare Important Message given?  YES (If response is "NO", the following Medicare IM given date fields will be blank) Date Medicare IM given:  10/04/2014 Medicare IM given by:  Davion Meara Date Additional Medicare IM given:   Additional Medicare IM given by:    Discharge Disposition:  [place]  Per UR Regulation:  Reviewed for med. necessity/level of care/duration of stay  If discussed at [place] of Stay Meetings, dates discussed:    Comments:  10/04/14  Ellan Lambert, RN, BSN [phone] Pt for dc home today with spouse.  Pt needs HH follow up. Referral to [facility], per pt and son choice.  Start of care 24-48h post dc date.  Referral to [facility] for home oxygen set up; pt also requests rollator for home.  Portable tank delivered to pt prior to dc.   Crystal Hutchinson RN, BSN, MSHL, CCM  Nurse - Case Manager,  (Unit [place]) [phone]  10/01/2014

## 2014-10-01 NOTE — ED Notes (Signed)
Ct notified pt completed contrast.

## 2014-10-02 DIAGNOSIS — J9621 Acute and chronic respiratory failure with hypoxia: Secondary | ICD-10-CM | POA: Diagnosis present

## 2014-10-02 DIAGNOSIS — K5909 Other constipation: Secondary | ICD-10-CM

## 2014-10-02 DIAGNOSIS — J189 Pneumonia, unspecified organism: Secondary | ICD-10-CM

## 2014-10-02 DIAGNOSIS — A419 Sepsis, unspecified organism: Principal | ICD-10-CM

## 2014-10-02 DIAGNOSIS — E43 Unspecified severe protein-calorie malnutrition: Secondary | ICD-10-CM

## 2014-10-02 DIAGNOSIS — R1310 Dysphagia, unspecified: Secondary | ICD-10-CM

## 2014-10-02 DIAGNOSIS — R0600 Dyspnea, unspecified: Secondary | ICD-10-CM

## 2014-10-02 DIAGNOSIS — C7802 Secondary malignant neoplasm of left lung: Secondary | ICD-10-CM

## 2014-10-02 DIAGNOSIS — C7801 Secondary malignant neoplasm of right lung: Secondary | ICD-10-CM

## 2014-10-02 LAB — BLOOD GAS, ARTERIAL
ACID-BASE EXCESS: 3.8 mmol/L — AB (ref 0.0–2.0)
BICARBONATE: 27.7 meq/L — AB (ref 20.0–24.0)
DRAWN BY: 35849
O2 CONTENT: 28 L/min
O2 Saturation: 93.4 %
PCO2 ART: 40.8 mmHg (ref 35.0–45.0)
Patient temperature: 98.6
TCO2: 28.9 mmol/L (ref 0–100)
pH, Arterial: 7.446 (ref 7.350–7.450)
pO2, Arterial: 70.5 mmHg — ABNORMAL LOW (ref 80.0–100.0)

## 2014-10-02 LAB — STREP PNEUMONIAE URINARY ANTIGEN: Strep Pneumo Urinary Antigen: NEGATIVE

## 2014-10-02 LAB — URINE MICROSCOPIC-ADD ON

## 2014-10-02 LAB — COMPREHENSIVE METABOLIC PANEL
ALK PHOS: 304 U/L — AB (ref 39–117)
ALT: 36 U/L (ref 0–53)
ANION GAP: 7 (ref 5–15)
AST: 52 U/L — AB (ref 0–37)
Albumin: 2.3 g/dL — ABNORMAL LOW (ref 3.5–5.2)
BILIRUBIN TOTAL: 1 mg/dL (ref 0.3–1.2)
BUN: 8 mg/dL (ref 6–23)
CHLORIDE: 95 mmol/L — AB (ref 96–112)
CO2: 29 mmol/L (ref 19–32)
Calcium: 8.5 mg/dL (ref 8.4–10.5)
Creatinine, Ser: 0.77 mg/dL (ref 0.50–1.35)
GFR calc Af Amer: >90 mL/min (ref >90)
GFR, EST NON AFRICAN AMERICAN: 81 mL/min — AB (ref >90)
Glucose, Bld: 149 mg/dL — ABNORMAL HIGH (ref 70–99)
Potassium: 3.8 mmol/L (ref 3.5–5.1)
Sodium: 131 mmol/L — ABNORMAL LOW (ref 135–145)
Total Protein: 5.9 g/dL — ABNORMAL LOW (ref 6.0–8.3)

## 2014-10-02 LAB — CBC WITH DIFFERENTIAL/PLATELET
BASOS PCT: 0 % (ref 0–1)
Basophils Absolute: 0 10*3/uL (ref 0.0–0.1)
EOS ABS: 0 10*3/uL (ref 0.0–0.7)
Eosinophils Relative: 0 % (ref 0–5)
HEMATOCRIT: 35.1 % — AB (ref 39.0–52.0)
Hemoglobin: 12.1 g/dL — ABNORMAL LOW (ref 13.0–17.0)
LYMPHS PCT: 11 % — AB (ref 12–46)
Lymphs Abs: 1 10*3/uL (ref 0.7–4.0)
MCH: 31.5 pg (ref 26.0–34.0)
MCHC: 34.5 g/dL (ref 30.0–36.0)
MCV: 91.4 fL (ref 78.0–100.0)
MONO ABS: 1.1 10*3/uL — AB (ref 0.1–1.0)
MONOS PCT: 11 % (ref 3–12)
NEUTROS PCT: 78 % — AB (ref 43–77)
Neutro Abs: 7.2 10*3/uL (ref 1.7–7.7)
Platelets: 282 10*3/uL (ref 150–400)
RBC: 3.84 MIL/uL — ABNORMAL LOW (ref 4.22–5.81)
RDW: 14.2 % (ref 11.5–15.5)
WBC: 9.3 10*3/uL (ref 4.0–10.5)

## 2014-10-02 LAB — URINALYSIS, ROUTINE W REFLEX MICROSCOPIC
Glucose, UA: NEGATIVE mg/dL
Hgb urine dipstick: NEGATIVE
Ketones, ur: 15 mg/dL — AB
LEUKOCYTES UA: NEGATIVE
Nitrite: NEGATIVE
PROTEIN: 30 mg/dL — AB
Specific Gravity, Urine: 1.026 (ref 1.005–1.030)
UROBILINOGEN UA: 2 mg/dL — AB (ref 0.0–1.0)
pH: 6 (ref 5.0–8.0)

## 2014-10-02 LAB — MAGNESIUM: Magnesium: 2 mg/dL (ref 1.5–2.5)

## 2014-10-02 MED ORDER — DEXTROSE 5 % IV SOLN
500.0000 mg | INTRAVENOUS | Status: DC
  Administered 2014-10-02: 500 mg via INTRAVENOUS
  Filled 2014-10-02 (×2): qty 500

## 2014-10-02 MED ORDER — SODIUM CHLORIDE 0.9 % IV SOLN
INTRAVENOUS | Status: DC
  Administered 2014-10-02 – 2014-10-03 (×2): via INTRAVENOUS

## 2014-10-02 MED ORDER — IPRATROPIUM-ALBUTEROL 0.5-2.5 (3) MG/3ML IN SOLN: 3.0000 mL | RESPIRATORY_TRACT | Status: DC | PRN

## 2014-10-02 MED ORDER — MEGESTROL ACETATE 400 MG/10ML PO SUSP
800.0000 mg | Freq: Every day | ORAL | Status: DC
  Administered 2014-10-02 – 2014-10-04 (×3): 800 mg via ORAL
  Filled 2014-10-02 (×3): qty 20

## 2014-10-02 MED ORDER — CEFTRIAXONE SODIUM IN DEXTROSE 20 MG/ML IV SOLN
1.0000 g | INTRAVENOUS | Status: DC
  Administered 2014-10-02: 1 g via INTRAVENOUS
  Filled 2014-10-02 (×2): qty 50

## 2014-10-02 MED ORDER — METHYLPREDNISOLONE SODIUM SUCC 125 MG IJ SOLR: 60.0000 mg | INTRAMUSCULAR | Status: DC

## 2014-10-02 MED ORDER — BISACODYL 5 MG PO TBEC
5.0000 mg | DELAYED_RELEASE_TABLET | Freq: Two times a day (BID) | ORAL | Status: DC
  Administered 2014-10-02 – 2014-10-04 (×4): 5 mg via ORAL
  Filled 2014-10-02 (×5): qty 1

## 2014-10-02 MED ORDER — METHYLPREDNISOLONE SODIUM SUCC 125 MG IJ SOLR
60.0000 mg | INTRAMUSCULAR | Status: DC
  Administered 2014-10-02: 60 mg via INTRAVENOUS
  Filled 2014-10-02: qty 2
  Filled 2014-10-02: qty 0.96

## 2014-10-02 MED ORDER — ENSURE COMPLETE PO LIQD
237.0000 mL | Freq: Three times a day (TID) | ORAL | Status: DC
  Administered 2014-10-02 – 2014-10-03 (×3): 237 mL via ORAL

## 2014-10-02 MED ORDER — ENOXAPARIN SODIUM 40 MG/0.4ML ~~LOC~~ SOLN
40.0000 mg | SUBCUTANEOUS | Status: DC
  Administered 2014-10-02 – 2014-10-03 (×2): 40 mg via SUBCUTANEOUS
  Filled 2014-10-02 (×3): qty 0.4

## 2014-10-02 MED ORDER — DOCUSATE SODIUM 100 MG PO CAPS
100.0000 mg | ORAL_CAPSULE | Freq: Every day | ORAL | Status: DC
  Administered 2014-10-02 – 2014-10-04 (×3): 100 mg via ORAL
  Filled 2014-10-02 (×3): qty 1

## 2014-10-02 MED ORDER — ACETAMINOPHEN-CODEINE 120-12 MG/5ML PO SOLN
10.0000 mL | Freq: Every day | ORAL | Status: DC
  Administered 2014-10-02 – 2014-10-03 (×2): 10 mL via ORAL
  Filled 2014-10-02: qty 10

## 2014-10-02 MED ORDER — IPRATROPIUM-ALBUTEROL 0.5-2.5 (3) MG/3ML IN SOLN
3.0000 mL | Freq: Four times a day (QID) | RESPIRATORY_TRACT | Status: DC
  Administered 2014-10-02: 3 mL via RESPIRATORY_TRACT
  Filled 2014-10-02: qty 3

## 2014-10-02 NOTE — Progress Notes (Signed)
  Echocardiogram 2D Echocardiogram has been performed.  Diamond Nickel 10/02/2014, 3:29 PM

## 2014-10-02 NOTE — Progress Notes (Signed)
[facility] PROGRESS NOTE  Jon Hicks OJJ:[id number] DOB: 03-04-1928 DOA: 10/01/2014 PCP: Jon Cower, MD  Assessment/Plan: Acute respiratory failure with hypoxia/CAP -Reviewed all radiology studies with patient and son -Reviewed all lab findings with patient and son -Obtain ABG -Obtain CBC with differential and CMP (patient most likely will meet sepsis guidelines.) -Continue O2 to maintain SPO2 89-93% -Obtain sputum culture -Obtain blood culture -Obtain urine culture -DuoNeb QID -Flutter valve -DC Spiriva (studies show that an acute respiratory failure medication does not reach intended area) -Solu-Medrol 60 mg daily -Mucinex DM BID -Tylenol+ codeine 10 mls QHS -Empiric treatment until labs return ceftriaxone, azithromycin -Respiratory virus panel pending -Legionella urine antigen pending -Strep pneumo urine antigen pending -Sputum culture pending -Social work consult for home health needs i.e. home oxygen, healthcare power of attorney? -CT angiogram negative for PE, however patient remains at high risk  Sepsis?/Postobstructive pneumonia -Normal saline at 75 Ml/hr -Obtain echocardiogram, as patient will most likely require significant hydration  Hemoptysis -Most likely secondary to patient's metastatic lung cancer, pneumonia, and recent biopsy  Adenocarcinoma - unclear primary site with metastatic disease to lung  - Biopsy results confirm that the lung mass is adenocarcinoma, likely an upper GI or pancreatobiliary primary. -scheduled to see Dr.Shadad (oncology) on Tuesday  Constipation -Bisacodyl 5 mg BID -Continue Colace 100 mg daily  Dysphagia -Speech to provide swallow eval  Protein calorie malnutrition severe -Ensure TID between meals -Megace 800 mg daily     Code Status: Full Family Communication: Son present Disposition Plan: Resolution respiratory failure   Consultants: NA   Procedures: 08/25/14 CT chest with contrast;- Diffuse  perilymphatic distribution of nodularity, basilar predominant, with conglomerate consolidation in both lower lobes. (Metastatic disease?) - - possible pancreatic mass in tail  1/22 CT angiogram chest PE protocol; -no filling defects in the pulmonary arterial tree to suggest acute pulmonary thromboembolism. -Innumerable pulmonary nodules bilaterally with confluent basilar mass effect  -2.0 cm mass in the tail of the pancreas    Cultures 1/23 Respiratory virus panel pending 1/23 Legionella urine antigen pending 1/23 Strep pneumo urine antigen pending 1/23 Sputum culture pending 1/23 urine pending 1/23 blood pending    Antibiotics: Ceftriaxone 1/23>> Azithromycin 1/23>>    DVT prophylaxis Lovenox    HPI/Subjective: Jon Hicks is a 81 y.o.WM PMHx  COPD, CAD, diagnosed on 09/23/2014 with left lower lung ADENOCARCINOMA (metastatic?). male who presents to the ED with chest discomfort around midnight. Patient has had a week long history of SOB as well. Increased cough with occasional small amount of bloody sputum. He had biopsy of lung on 1/14 for lung mass which he has not received the results for yet but has an appointment scheduled with Dr. Alen Blew on Tuesday of next week. In addition to the lung mass patient has had ongoing unintentional weight loss. Patient tried NTG at home with improvement in CP. ASA given en-route. EMS noted initial O2 sat in the mid 80s, improved with O2 via [place].  1/23 states positive DOE not on home O2, positive productive cough (yellow)/blood-tinged at times, positive anorexia. States scheduled to see Dr.Shadad (oncology) on Tuesday for first visit `  Objective: Filed Vitals:   10/02/14 0128 10/02/14 0706 10/02/14 0917 10/02/14 0918  BP: 135/68 139/67  135/68  Pulse: 73 67  87  Temp: 98.9 F (37.2 C) 98.4 F (36.9 C)    TempSrc: Oral Oral    Resp: 18 20  20   Height:      Weight:  53.434 kg (117  lb 12.8 oz)    SpO2: 92% 91% 91% 95%     Intake/Output Summary (Last 24 hours) at 10/02/14 1322 Last data filed at 10/02/14 0921  Gross per 24 hour  Intake    783 ml  Output    626 ml  Net    157 ml   Filed Weights   10/01/14 0819 10/02/14 0706  Weight: 54.2 kg (119 lb 7.8 oz) 53.434 kg (117 lb 12.8 oz)     Exam: General:A/O 4, acute respiratory distressWith O2 on can speak in 8 word sentences.  Lungs::  decreased to absent breath sounds bibasilar, poor air movement remainder of lobes. Positive rhonchi bilateral apices.  Cardiovascular: Regular rate and rhythm without murmur gallop or rub normal S1 and S2 Abdomen: Nontender, nondistended, soft, bowel sounds positive, no rebound, no ascites, no appreciable mass Extremities: No significant cyanosis, clubbing, or edema bilateral lower extremities   Data Reviewed: Basic Metabolic Panel:  Recent Labs Lab 10/01/14 0445  NA 130*  K 3.8  CL 94*  CO2 26  GLUCOSE 133*  BUN 8  CREATININE 0.71  CALCIUM 8.3*   Liver Function Tests:  Recent Labs Lab 10/01/14 0445  AST 34  ALT 28  ALKPHOS 205*  BILITOT 1.7*  PROT 6.1  ALBUMIN 2.4*   No results for input(s): LIPASE, AMYLASE in the last 168 hours. No results for input(s): AMMONIA in the last 168 hours. CBC:  Recent Labs Lab 10/01/14 0445  WBC 11.4*  NEUTROABS 8.9*  HGB 12.0*  HCT 34.7*  MCV 91.1  PLT 264   Cardiac Enzymes:  Recent Labs Lab 10/01/14 0445 10/01/14 0628 10/01/14 1215 10/01/14 1754  TROPONINI <0.03 0.03 0.04* 0.03   BNP (last 3 results) No results for input(s): PROBNP in the last 8760 hours. CBG: No results for input(s): GLUCAP in the last 168 hours.  No results found for this or any previous visit (from the past 240 hour(s)).   Studies: Ct Angio Chest Pe W/cm &/or Wo Cm  10/01/2014   CLINICAL DATA:  Lower abdominal pain and nausea  EXAM: CT ANGIOGRAPHY CHEST  CT ABDOMEN AND PELVIS WITH CONTRAST  TECHNIQUE: Multidetector CT imaging of the chest was performed using the  standard protocol during bolus administration of intravenous contrast. Multiplanar CT image reconstructions and MIPs were obtained to evaluate the vascular anatomy. Multidetector CT imaging of the abdomen and pelvis was performed using the standard protocol during bolus administration of intravenous contrast.  CONTRAST:  146mL OMNIPAQUE IOHEXOL 350 MG/ML SOLN  COMPARISON:  09/17/2014.  08/25/2014  FINDINGS: CTA CHEST FINDINGS  There are no filling defects in the pulmonary arterial tree to suggest acute pulmonary thromboembolism.  Atherosclerotic calcifications of the aortic arch and within the coronary arteries is stable.  No abnormal mediastinal adenopathy.  Innumerable pulmonary nodules bilaterally with confluent basilar mass effect is not significantly changed.  No pneumothorax or pleural effusion.  No definite acute rib fracture. Left anterior rib deformities have a chronic appearance. Stable thoracic spine with mid thoracic kyphosis.  CT ABDOMEN and PELVIS FINDINGS  Postcholecystectomy  Liver, spleen, kidneys, adrenal glands are within normal limits.  2.0 cm mass in the tail of the pancreas is stable.  Bladder and prostate are unremarkable.  Trace free fluid in the right hemipelvis.  Atherosclerotic changes of the visceral vasculature are noted. The celiac and SMA are grossly patent with calcified plaque at the origin. Mild calcified and smooth plaque in the lower aorta. Iliac arteries are also grossly patent.  Lobulated densities to the left of the aorta are either non-opacified veins or small lymph nodes.  Sigmoid diverticulosis. No obvious evidence of acute diverticulitis.  Advanced degenerative disc disease at L5-S1.  Review of the MIP images confirms the above findings.  IMPRESSION: No evidence of acute pulmonary thromboembolism.  Bilateral pulmonary nodules with confluent mass effect at the lung bases are stable.  Stable mass in the tail of the pancreas.  Trace free fluid in the pelvis is nonspecific.    Electronically Signed   By: Maryclare Bean M.D.   On: 10/01/2014 07:22   Ct Abdomen Pelvis W Contrast  10/01/2014   CLINICAL DATA:  Lower abdominal pain and nausea  EXAM: CT ANGIOGRAPHY CHEST  CT ABDOMEN AND PELVIS WITH CONTRAST  TECHNIQUE: Multidetector CT imaging of the chest was performed using the standard protocol during bolus administration of intravenous contrast. Multiplanar CT image reconstructions and MIPs were obtained to evaluate the vascular anatomy. Multidetector CT imaging of the abdomen and pelvis was performed using the standard protocol during bolus administration of intravenous contrast.  CONTRAST:  132mL OMNIPAQUE IOHEXOL 350 MG/ML SOLN  COMPARISON:  09/17/2014.  08/25/2014  FINDINGS: CTA CHEST FINDINGS  There are no filling defects in the pulmonary arterial tree to suggest acute pulmonary thromboembolism.  Atherosclerotic calcifications of the aortic arch and within the coronary arteries is stable.  No abnormal mediastinal adenopathy.  Innumerable pulmonary nodules bilaterally with confluent basilar mass effect is not significantly changed.  No pneumothorax or pleural effusion.  No definite acute rib fracture. Left anterior rib deformities have a chronic appearance. Stable thoracic spine with mid thoracic kyphosis.  CT ABDOMEN and PELVIS FINDINGS  Postcholecystectomy  Liver, spleen, kidneys, adrenal glands are within normal limits.  2.0 cm mass in the tail of the pancreas is stable.  Bladder and prostate are unremarkable.  Trace free fluid in the right hemipelvis.  Atherosclerotic changes of the visceral vasculature are noted. The celiac and SMA are grossly patent with calcified plaque at the origin. Mild calcified and smooth plaque in the lower aorta. Iliac arteries are also grossly patent.  Lobulated densities to the left of the aorta are either non-opacified veins or small lymph nodes.  Sigmoid diverticulosis. No obvious evidence of acute diverticulitis.  Advanced degenerative disc disease at  L5-S1.  Review of the MIP images confirms the above findings.  IMPRESSION: No evidence of acute pulmonary thromboembolism.  Bilateral pulmonary nodules with confluent mass effect at the lung bases are stable.  Stable mass in the tail of the pancreas.  Trace free fluid in the pelvis is nonspecific.   Electronically Signed   By: Maryclare Bean M.D.   On: 10/01/2014 07:22   Dg Chest Port 1 View  10/01/2014   CLINICAL DATA:  Shortness of breath  EXAM: PORTABLE CHEST - 1 VIEW  COMPARISON:  09/23/2014  FINDINGS: Innumerable pulmonary nodules creating confluent opacities in the lower lungs is unchanged. No superimposed edema or pneumonia is identified, although an additional pathology could be easily obscured. There is no evidence for pleural effusion or pneumothorax. No cardiomegaly. Visible aortic contours are negative.  IMPRESSION: Diffuse pulmonary nodularity, confluent at the bases, is unchanged from prior. No evidence of acute superimposed disease.   Electronically Signed   By: Jorje Guild M.D.   On: 10/01/2014 05:12    Scheduled Meds: . antiseptic oral rinse  7 mL Mouth Rinse BID  . atorvastatin  10 mg Oral q1800  . cholecalciferol  2,000 Units Oral Daily  .  feeding supplement (ENSURE COMPLETE)  237 mL Oral BID BM  . mometasone-formoterol  2 puff Inhalation BID  . sodium chloride  3 mL Intravenous Q12H  . 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 If 7PM-7AM, please contact night-coverage at [email or web address], password [facility] 10/02/2014, 1:22 PM  LOS: 1 day

## 2014-10-02 NOTE — Progress Notes (Signed)
ANTICOAGULATION and ANTIBIOTIC CONSULT NOTE - Initial Consult  Pharmacy Consult for Lovenox and Ceftriaxone Indication: DVT prophylaxis and CAP  No Known Allergies  Patient Measurements: Height: 5\' 9"  (175.3 cm) Weight: 117 lb 12.8 oz (53.434 kg) (scale b) IBW/kg (Calculated) : 70.7  Vital Signs: Temp: 98.4 F (36.9 C) (01/23 0706) Temp Source: Oral (01/23 0706) BP: 135/68 mmHg (01/23 0918) Pulse Rate: 87 (01/23 0918)  Labs:  Recent Labs  10/01/14 0445 10/01/14 0628 10/01/14 1215 10/01/14 1754  HGB 12.0*  --   --   --   HCT 34.7*  --   --   --   PLT 264  --   --   --   CREATININE 0.71  --   --   --   TROPONINI <0.03 0.03 0.04* 0.03    Estimated Creatinine Clearance: 51.9 mL/min (by C-G formula based on Cr of 0.71).   Medical History: Past Medical History  Diagnosis Date  . DJD (degenerative joint disease)     (no psoriatic arthritis per pt.); hx of left knee cort[facility]one at [facility]  . Prostatitis     hx  . Allergic rhinitis   . ALLERGIC RHINITIS 11/22/2009  . COPD 11/22/2009  . GERD 11/22/2009  . PSORIASIS 11/22/2009  . Vitamin D deficiency 12/01/2010  . CAD (coronary artery disease)     a. s/p DES distal RCA, 20-30% sten in dist L main,  50-60% sten LAD,  40-50% sten prox vessel, 75-80% sten D1, 30-40% sten LCx, EF 55-60%  . Tobacco abuse   . Lung mass 09/2014  . Adenocarcinoma     LUNG    Medications:  Scheduled:  . acetaminophen-codeine  10 mL Oral QHS  . antiseptic oral rinse  7 mL Mouth Rinse BID  . atorvastatin  10 mg Oral q1800  . azithromycin  500 mg Intravenous Q24H  . bisacodyl  5 mg Oral BID  . cholecalciferol  2,000 Units Oral Daily  . docusate sodium  100 mg Oral Daily  . feeding supplement (ENSURE COMPLETE)  237 mL Oral TID BM  . ipratropium-albuterol  3 mL Nebulization Q6H  . megestrol  800 mg Oral Daily  . methylPREDNISolone (SOLU-MEDROL) injection  60 mg Intravenous Q24H  . mometasone-formoterol  2 puff Inhalation BID  . sodium  chloride  3 mL Intravenous Q12H   Infusions:  . sodium chloride      Assessment: 81 yo M admitted on 10/01/2014 with chest discomfort and SOB. Patient is to start on empiric abx including CTX for CAP. He is currently afebrile, WBC slightly elevated at 11.4, blood, sputum and urine cultures sent. Pharmacy was also consulted to dose VTE prophylaxis lovenox. CBC is slightly low but plt wnl and patient is having some hemoptysis. SCr is 0.71 with estimated CrCl ~51 ml/min.   Goal of Therapy:  Monitor platelets by anticoagulation protocol: Yes   Plan:  - Start CTX 1 gm IV q24h - Continue Azithromycin per MD - Monitor temp, WBC and C&S  - Start lovenox 40 mg SQ q24h - Monitor CBC and for continued or worsening s/s bleeding  - Pharmacy will sign off, please re-consult if needed  Carsen Machi K. Velva Harman, PharmD Clinical Pharmacist - Resident Pager: [phone] Pharmacy: [phone] 10/02/2014 3:30 PM

## 2014-10-03 DIAGNOSIS — R0602 Shortness of breath: Secondary | ICD-10-CM | POA: Diagnosis present

## 2014-10-03 DIAGNOSIS — C801 Malignant (primary) neoplasm, unspecified: Secondary | ICD-10-CM | POA: Diagnosis present

## 2014-10-03 LAB — EXPECTORATED SPUTUM ASSESSMENT W GRAM STAIN, RFLX TO RESP C

## 2014-10-03 LAB — EXPECTORATED SPUTUM ASSESSMENT W REFEX TO RESP CULTURE

## 2014-10-03 MED ORDER — HYDROCOD POLST-CHLORPHEN POLST 10-8 MG/5ML PO LQCR
5.0000 mL | Freq: Two times a day (BID) | ORAL | Status: DC | PRN
  Administered 2014-10-04: 5 mL via ORAL
  Filled 2014-10-03: qty 5

## 2014-10-03 MED ORDER — PREDNISONE 50 MG PO TABS
60.0000 mg | ORAL_TABLET | Freq: Every day | ORAL | Status: DC
  Administered 2014-10-04: 60 mg via ORAL
  Filled 2014-10-03 (×2): qty 1

## 2014-10-03 MED ORDER — DM-GUAIFENESIN ER 30-600 MG PO TB12
1.0000 | ORAL_TABLET | Freq: Two times a day (BID) | ORAL | Status: DC
  Administered 2014-10-03 – 2014-10-04 (×3): 1 via ORAL
  Filled 2014-10-03 (×4): qty 1

## 2014-10-03 MED ORDER — ASPIRIN EC 81 MG PO TBEC
81.0000 mg | DELAYED_RELEASE_TABLET | Freq: Every day | ORAL | Status: DC
  Administered 2014-10-03 – 2014-10-04 (×2): 81 mg via ORAL
  Filled 2014-10-03 (×2): qty 1

## 2014-10-03 MED ORDER — CEFUROXIME AXETIL 500 MG PO TABS
500.0000 mg | ORAL_TABLET | Freq: Two times a day (BID) | ORAL | Status: DC
  Administered 2014-10-03 – 2014-10-04 (×2): 500 mg via ORAL
  Filled 2014-10-03 (×4): qty 1

## 2014-10-03 MED ORDER — AZITHROMYCIN 500 MG PO TABS
500.0000 mg | ORAL_TABLET | Freq: Every day | ORAL | Status: DC
  Administered 2014-10-03 – 2014-10-04 (×2): 500 mg via ORAL
  Filled 2014-10-03 (×2): qty 1

## 2014-10-03 MED ORDER — IPRATROPIUM-ALBUTEROL 0.5-2.5 (3) MG/3ML IN SOLN
3.0000 mL | Freq: Four times a day (QID) | RESPIRATORY_TRACT | Status: DC
  Administered 2014-10-03 – 2014-10-04 (×5): 3 mL via RESPIRATORY_TRACT
  Filled 2014-10-03 (×4): qty 3

## 2014-10-03 MED ORDER — ASPIRIN EC 81 MG PO TBEC: 81.0000 mg | DELAYED_RELEASE_TABLET | Freq: Every day | ORAL | Status: DC

## 2014-10-03 NOTE — Progress Notes (Signed)
**Note Jon-Identified via Obfuscation** [facility] PROGRESS NOTE  Jon Hicks WGY:[id number] DOB: 1928-07-02 DOA: 10/01/2014 PCP: Cathlean Cower, MD  Assessment/Plan: Acute respiratory failure with hypoxia/CAP -Reviewed all radiology studies with patient and son -Reviewed all lab findings with patient and son -Obtain CBC with differential and CMP (patient most likely will meet sepsis guidelines.) -Continue O2 to maintain SPO2 89-93% -sputum culture pending - blood culture pending -urine culture pending -DuoNeb QID -Flutter valve -DC Spiriva (studies show that an acute respiratory failure medication does not reach intended area) -Solu-Medrol 60 mg daily; changed to prednisone 50 mg daily -Mucinex DM BID -Tylenol+ codeine 10 mls QHS -Continue Empiric treatment until labs return ceftriaxone, azithromycin -Respiratory virus panel pending -Legionella urine antigen pending -Strep pneumo urine antigen pending -Sputum culture pending -Social work consult for home health needs i.e. home oxygen, healthcare power of attorney? -CT angiogram negative for PE, however patient remains at high risk -SATURATION QUALIFICATIONS: (This note is used to comply with regulatory documentation for home oxygen) Patient Saturations on Room Air at Rest = 91% Patient Saturations on Room Air while Ambulating = 88% patient will need to be discharged on home O2 would start him on 2 L O2 via [place]   Sepsis?/Postobstructive pneumonia -Continue Normal saline at 75 Ml/hr -Echocardiogram; borderline see results below  Hemoptysis -Most likely secondary to patient's metastatic lung cancer, pneumonia, and recent biopsy  Adenocarcinoma - unclear primary site with metastatic disease to lung  - Biopsy results confirm that the lung mass is adenocarcinoma, likely an upper GI or pancreatobiliary primary. -scheduled to see Dr.Shadad (oncology) on Tuesday  Constipation -Bisacodyl 5 mg BID -Continue Colace 100 mg daily  Dysphagia -Speech; recommends  Dysphagia 3 (Mechanical Soft);Thin liquid   Protein calorie malnutrition severe -Ensure TID between meals -Megace 800 mg daily     Code Status: Full Family Communication: Son present Disposition Plan: Resolution respiratory failure   Consultants: NA   Procedures: 08/25/14 CT chest with contrast;- Diffuse perilymphatic distribution of nodularity, basilar predominant, with conglomerate consolidation in both lower lobes. (Metastatic disease?) - - possible pancreatic mass in tail  1/22 CT angiogram chest PE protocol; -no filling defects in the pulmonary arterial tree to suggest acute pulmonary thromboembolism. -Innumerable pulmonary nodules bilaterally with confluent basilar mass effect  -2.0 cm mass in the tail of the pancreas 1/23 echocardiogram;Left ventricle: mild LVH. LVEF= 55% to 60%. - Left atrium:  mildly dilated. - Pulmonary arteries: PA peak pressure: 33 mm Hg (S).    Cultures 1/23 Respiratory virus panel pending 1/23 Legionella urine antigen pending 1/23 Strep pneumo urine antigen negative 1/23 Sputum culture pending 1/23 urine pending 1/23 blood pending    Antibiotics: Ceftriaxone 1/23>> stopped 1/24 Azithromycin 1/23>> Ceftin 1/24>>   DVT prophylaxis Lovenox    HPI/Subjective: Jon Hicks is a 81 y.o.WM PMHx  COPD, CAD, diagnosed on 09/23/2014 with left lower lung ADENOCARCINOMA (metastatic?). male who presents to the ED with chest discomfort around midnight. Patient has had a week long history of SOB as well. Increased cough with occasional small amount of bloody sputum. He had biopsy of lung on 1/14 for lung mass which he has not received the results for yet but has an appointment scheduled with Dr. Alen Blew on Tuesday of next week. In addition to the lung mass patient has had ongoing unintentional weight loss. Patient tried NTG at home with improvement in CP. ASA given en-route. EMS noted initial O2 sat in the mid 80s, improved with O2  via [place].  1/24 states continued positive DOE (  not on home O2), cough less productive today, but still  (yellow), positive anorexia. States scheduled to see Dr.Shadad (oncology) on Tuesday for first visit `  Objective: Filed Vitals:   10/02/14 2140 10/03/14 0459 10/03/14 0805 10/03/14 0900  BP:  132/73  116/61  Pulse:  78  84  Temp:  97.9 F (36.6 C)  97.8 F (36.6 C)  TempSrc:  Oral  Oral  Resp:  18  18  Height:      Weight:  53.193 kg (117 lb 4.3 oz)    SpO2: 95% 95% 96% 96%    Intake/Output Summary (Last 24 hours) at 10/03/14 1358 Last data filed at 10/03/14 1000  Gross per 24 hour  Intake 1811.25 ml  Output   1400 ml  Net 411.25 ml   Filed Weights   10/01/14 0819 10/02/14 0706 10/03/14 0459  Weight: 54.2 kg (119 lb 7.8 oz) 53.434 kg (117 lb 12.8 oz) 53.193 kg (117 lb 4.3 oz)     Exam: General:A/O 4, acute respiratory distress decreased today can speak in full sentences .  Lungs::  decreased to absent breath sounds bibasilar, poor air movement remainder of lobes. Positive rhonchi bilateral apices.  Cardiovascular: Regular rate and rhythm without murmur gallop or rub normal S1 and S2 Abdomen: Nontender, nondistended, soft, bowel sounds positive, no rebound, no ascites, no appreciable mass Extremities: No significant cyanosis, clubbing, or edema bilateral lower extremities   Data Reviewed: Basic Metabolic Panel:  Recent Labs Lab 10/01/14 0445 10/02/14 1445  NA 130* 131*  K 3.8 3.8  CL 94* 95*  CO2 26 29  GLUCOSE 133* 149*  BUN 8 8  CREATININE 0.71 0.77  CALCIUM 8.3* 8.5  MG  --  2.0   Liver Function Tests:  Recent Labs Lab 10/01/14 0445 10/02/14 1445  AST 34 52*  ALT 28 36  ALKPHOS 205* 304*  BILITOT 1.7* 1.0  PROT 6.1 5.9*  ALBUMIN 2.4* 2.3*   No results for input(s): LIPASE, AMYLASE in the last 168 hours. No results for input(s): AMMONIA in the last 168 hours. CBC:  Recent Labs Lab 10/01/14 0445 10/02/14 1445  WBC 11.4* 9.3  NEUTROABS  8.9* 7.2  HGB 12.0* 12.1*  HCT 34.7* 35.1*  MCV 91.1 91.4  PLT 264 282   Cardiac Enzymes:  Recent Labs Lab 10/01/14 0445 10/01/14 0628 10/01/14 1215 10/01/14 1754  TROPONINI <0.03 0.03 0.04* 0.03   BNP (last 3 results) No results for input(s): PROBNP in the last 8760 hours. CBG: No results for input(s): GLUCAP in the last 168 hours.  Recent Results (from the past 240 hour(s))  Culture, expectorated sputum-assessment     Status: None   Collection Time: 10/03/14  5:36 AM  Result Value Ref Range Status   Specimen Description SPUTUM  Final   Special Requests NONE  Final   Sputum evaluation   Final    THIS SPECIMEN IS ACCEPTABLE. RESPIRATORY CULTURE REPORT TO FOLLOW.   Report Status 10/03/2014 FINAL  Final     Studies: No results found.  Scheduled Meds: . acetaminophen-codeine  10 mL Oral QHS  . antiseptic oral rinse  7 mL Mouth Rinse BID  . atorvastatin  10 mg Oral q1800  . azithromycin  500 mg Intravenous Q24H  . bisacodyl  5 mg Oral BID  . cefTRIAXone (ROCEPHIN)  IV  1 g Intravenous Q24H  . cholecalciferol  2,000 Units Oral Daily  . docusate sodium  100 mg Oral Daily  . enoxaparin (LOVENOX) injection  40  mg Subcutaneous Q24H  . feeding supplement (ENSURE COMPLETE)  237 mL Oral TID BM  . megestrol  800 mg Oral Daily  . methylPREDNISolone (SOLU-MEDROL) injection  60 mg Intravenous Q24H  . mometasone-formoterol  2 puff Inhalation BID  . sodium chloride  3 mL Intravenous Q12H   Continuous Infusions: . 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 If 7PM-7AM, please contact night-coverage at [email or web address], password [facility] 10/03/2014, 1:58 PM  LOS: 2 days    Care during the described time interval was provided by me. I have reviewed this patient's available data, including medical history, events of note, physical examination, radiology findings and test results as part of my evaluation  Dia Crawford, MD [phone] Pager

## 2014-10-03 NOTE — Progress Notes (Signed)
SATURATION QUALIFICATIONS: (This note is used to comply with regulatory documentation for home oxygen)  Patient Saturations on Room Air at Rest =91%  Patient Saturations on Room Air while Ambulating =89%  Patient Saturations on 2 Liters of oxygen while Ambulating = 92%  Jon Hicks I 10/03/2014 11:07 AM

## 2014-10-03 NOTE — Evaluation (Signed)
Clinical/Bedside Swallow Evaluation Patient Details  Name: Jon Hicks MRN: [id number] Date of Birth: [birth year]  Today's Date: 10/03/2014 Time: SLP Start Time (ACUTE ONLY): 1000 SLP Stop Time (ACUTE ONLY): 1030 SLP Time Calculation (min) (ACUTE ONLY): 30 min  Past Medical History:  Past Medical History  Diagnosis Date  . DJD (degenerative joint disease)     (no psoriatic arthritis per pt.); hx of left knee cortisone at [facility]  . Prostatitis     hx  . Allergic rhinitis   . ALLERGIC RHINITIS 11/22/2009  . COPD 11/22/2009  . GERD 11/22/2009  . PSORIASIS 11/22/2009  . Vitamin D deficiency 12/01/2010  . CAD (coronary artery disease)     a. s/p DES distal RCA, 20-30% sten in dist L main,  50-60% sten LAD,  40-50% sten prox vessel, 75-80% sten D1, 30-40% sten LCx, EF 55-60%  . Tobacco abuse   . Lung mass 09/2014  . Adenocarcinoma     LUNG   Past Surgical History:  Past Surgical History  Procedure Laterality Date  . Cholecystectomy    . Edg      Dr. Miguel Rota esophageal nodule-normal tissue, 2000  . Cardiolite      normal exercise cardiolite 1999  . Urtheral      s/p urtheral stricture dilation-per urology-Dr. Hunt 1984  . Herniorrhapy      inguinal herniorrhapy-bilat  . Left heart catheterization with coronary angiogram N/A 07/27/2013    Procedure: LEFT HEART CATHETERIZATION WITH CORONARY ANGIOGRAM;  Surgeon: Blane Ohara, MD;  Location: [facility] CATH LAB;  Service: Cardiovascular;  Laterality: N/A;  . Video bronchoscopy Bilateral 08/31/2014    Procedure: VIDEO BRONCHOSCOPY WITH FLUORO;  Surgeon: Tanda Rockers, MD;  Location: [facility] ENDOSCOPY;  Service: Cardiopulmonary;  Laterality: Bilateral;  . Lung biopsy     HPI:  Jon Hicks is a 81 y.o. male who presents to the ED with chest discomfort around midnight.  Patient has had a week long history of SOB as well.  Increased cough with occasional small amount of bloody sputum.  He had biopsy of lung on 1/14 for lung mass  which he has not received the results for yet but has an appointment scheduled with Dr. Alen Blew on Tuesday of next week..In addition to the lung mass patient has had ongoing unintentional weight loss.   Assessment / Plan / Recommendation Clinical Impression  Patient presents with a mild oropharyngeal dysphagia characterized by decreased mastication and manipulation/transit of solid texture boluses and increased effort and WOB with solid texture PO's. Patient also with impact from decreased respiratory support and question impact of GERD (per patient he has taken pepcid for his GERD). Patient c/o globus sensation in his throat and asked if it might be "closed off".     Aspiration Risk  Mild    Diet Recommendation Dysphagia 3 (Mechanical Soft);Thin liquid   Liquid Administration via: Cup;Straw Medication Administration: Whole meds with liquid Supervision: Patient able to self feed Compensations: Slow rate;Small sips/bites Postural Changes and/or Swallow Maneuvers: Seated upright 90 degrees;Upright 30-60 min after meal    Other  Recommendations Recommended Consults: Other (Comment) Oral Care Recommendations: Oral care BID   Follow Up Recommendations  Home health SLP    Frequency and Duration min 2x/week  2 weeks   Pertinent Vitals/Pain     SLP Swallow Goals     Swallow Study Prior Functional Status       General Date of Onset: 10/01/14 HPI: Jon Hicks is a 81 y.o.  male who presents to the ED with chest discomfort around midnight.  Patient has had a week long history of SOB as well.  Increased cough with occasional small amount of bloody sputum.  He had biopsy of lung on 1/14 for lung mass which he has not received the results for yet but has an appointment scheduled with Dr. Alen Blew on Tuesday of next week..In addition to the lung mass patient has had ongoing unintentional weight loss. Type of Study: Bedside swallow evaluation Previous Swallow Assessment: N/A Diet Prior to  this Study: Regular;Thin liquids Temperature Spikes Noted: No Respiratory Status: Nasal cannula History of Recent Intubation: No Behavior/Cognition: Cooperative;Alert;Pleasant mood Oral Cavity - Dentition: Adequate natural dentition Self-Feeding Abilities: Able to feed self Patient Positioning: Upright in bed Baseline Vocal Quality: Clear Volitional Cough: Strong Volitional Swallow: Able to elicit    Oral/Motor/Sensory Function Overall Oral Motor/Sensory Function: Appears within functional limits for tasks assessed   Ice Chips Ice chips: Not tested   Thin Liquid Thin Liquid: Within functional limits Presentation: Cup;Self Fed;Straw Other Comments: No overt s/s aspiration with thin liquids    Nectar Thick Nectar Thick Liquid: Not tested   Honey Thick Honey Thick Liquid: Not tested   Puree Puree: Within functional limits   Solid   GO    Solid: Impaired Presentation: Self Fed Oral Phase Impairments: Impaired anterior to posterior transit;Impaired mastication Pharyngeal Phase Impairments: Multiple swallows;Suspected delayed Swallow;Other (comments) (increased effort and WOB with mastication and swallow of hard solid)       Nadara Mode Tarrell 10/03/2014,1:40 PM    Sonia Baller, MA, CCC-SLP 10/03/2014 1:40 PM

## 2014-10-03 NOTE — Progress Notes (Signed)
SATURATION QUALIFICATIONS: (This note is used to comply with regulatory documentation for home oxygen)  Patient Saturations on Room Air at Rest = 91%  Patient Saturations on Room Air while Ambulating = 88%  Jon Hicks I 10/03/2014 3:12 PM

## 2014-10-04 LAB — URINE CULTURE
COLONY COUNT: NO GROWTH
Culture: NO GROWTH

## 2014-10-04 MED ORDER — ASPIRIN 81 MG PO TBEC: 81.0000 mg | DELAYED_RELEASE_TABLET | Freq: Every day | ORAL | Status: AC

## 2014-10-04 MED ORDER — AZITHROMYCIN 500 MG PO TABS: 500.0000 mg | ORAL_TABLET | Freq: Every day | ORAL | Status: AC

## 2014-10-04 MED ORDER — PREDNISONE 10 MG PO TABS: ORAL_TABLET | ORAL | Status: DC

## 2014-10-04 MED ORDER — BUDESONIDE-FORMOTEROL FUMARATE 160-4.5 MCG/ACT IN AERO: 2.0000 | INHALATION_SPRAY | Freq: Two times a day (BID) | RESPIRATORY_TRACT | Status: AC

## 2014-10-04 MED ORDER — CEFUROXIME AXETIL 500 MG PO TABS: 500.0000 mg | ORAL_TABLET | Freq: Two times a day (BID) | ORAL | Status: AC

## 2014-10-04 MED ORDER — HYDROCOD POLST-CHLORPHEN POLST 10-8 MG/5ML PO LQCR: 5.0000 mL | Freq: Two times a day (BID) | ORAL | Status: AC | PRN

## 2014-10-04 MED ORDER — ALBUTEROL SULFATE HFA 108 (90 BASE) MCG/ACT IN AERS: 2.0000 | INHALATION_SPRAY | Freq: Four times a day (QID) | RESPIRATORY_TRACT | Status: DC | PRN

## 2014-10-04 MED ORDER — MEGESTROL ACETATE 400 MG/10ML PO SUSP: 800.0000 mg | Freq: Every day | ORAL | Status: AC

## 2014-10-04 MED ORDER — PREDNISONE 10 MG PO TABS: ORAL_TABLET | ORAL | Status: AC

## 2014-10-04 NOTE — Discharge Summary (Signed)
Physician Discharge Summary  Patient ID: QUINNTIN MALTER MRN: [id number] DOB/AGE: 08-Jan-1928 81 y.o.  Admit date: 10/01/2014 Discharge date: 10/04/2014  Primary Care Physician:  Cathlean Cower, MD  Discharge Diagnoses:   . Acute on chronic respiratory failure with hypoxia . 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 Hemoptysis . postobstructive pneumonia  . Sepsis due to undetermined organism . Other constipation . Protein-calorie malnutrition, severe . Adenocarcinoma  Consults: None   Recommendations for Outpatient Follow-up:  Patient has an appointment with Dr. Alen Blew oncology on 10/05/13    DIET: Dysphagia 3 months mechanical soft diet, thin liquids    Allergies:  No Known Allergies   Discharge Medications:   Medication List    STOP taking these medications        levofloxacin 250 MG tablet  Commonly known as:  LEVAQUIN      TAKE these medications        acetaminophen-codeine 300-30 MG per tablet  Commonly known as:  TYLENOL #3  Take 1 tablet by mouth every 4 (four) hours as needed.     albuterol 108 (90 BASE) MCG/ACT inhaler  Commonly known as:  PROVENTIL HFA;VENTOLIN HFA  Inhale 2 puffs into the lungs every 6 (six) hours as needed for wheezing or shortness of breath.     albuterol 108 (90 BASE) MCG/ACT inhaler  Commonly known as:  PROVENTIL HFA;VENTOLIN HFA  Inhale 2 puffs into the lungs every 6 (six) hours as needed for wheezing or shortness of breath.     aspirin 81 MG EC tablet  Take 1 tablet (81 mg total) by mouth daily.     atorvastatin 10 MG tablet  Commonly known as:  LIPITOR  TAKE 1 TABLET  BY MOUTH DAILY AT 6 PM.     azithromycin 500 MG tablet  Commonly known as:  ZITHROMAX  Take 1 tablet (500 mg total) by mouth daily. X 1WEEK     budesonide-formoterol 160-4.5 MCG/ACT inhaler  Commonly known as:  SYMBICORT  Inhale 2 puffs into the lungs 2 (two) times daily.     cefUROXime 500 MG tablet  Commonly known as:  CEFTIN   Take 1 tablet (500 mg total) by mouth 2 (two) times daily with a meal.     chlorpheniramine-HYDROcodone 10-8 MG/5ML Lqcr  Commonly known as:  TUSSIONEX  Take 5 mLs by mouth every 12 (twelve) hours as needed for cough.     cholecalciferol 400 UNITS Tabs tablet  Commonly known as:  VITAMIN D  Take 2,000 Units by mouth daily.     clobetasol cream 0.05 %  Commonly known as:  TEMOVATE  Apply 1 application topically daily as needed (psoriasis).     desonide 0.05 % cream  Commonly known as:  DESOWEN  Apply 1 application topically 2 (two) times daily as needed (rash).     dextromethorphan-guaiFENesin 30-600 MG per 12 hr tablet  Commonly known as:  MUCINEX DM  Take 1 tablet by mouth 2 (two) times daily as needed for cough.     docusate sodium 100 MG capsule  Commonly known as:  COLACE  Take 100 mg by mouth daily as needed for mild constipation.     fluocinonide cream 0.05 %  Commonly known as:  LIDEX  Apply 1 application topically 2 (two) times daily as needed (psoriasis).     fluticasone 50 MCG/ACT nasal spray  Commonly known as:  FLONASE  Place 2 sprays into both nostrils 2 (two) times daily as needed for allergies.  Fluticasone-Salmeterol 250-50 MCG/DOSE Aepb  Commonly known as:  ADVAIR DISKUS  Inhale 1 puff into the lungs 2 (two) times daily.     ICAPS AREDS FORMULA PO  Take by mouth daily.     loratadine 10 MG tablet  Commonly known as:  CLARITIN  Take 10 mg by mouth daily as needed for allergies.     megestrol 400 MG/10ML suspension  Commonly known as:  MEGACE  Take 20 mLs (800 mg total) by mouth daily.     nitroGLYCERIN 0.4 MG SL tablet  Commonly known as:  NITROSTAT  Place 1 tablet (0.4 mg total) under the tongue every 5 (five) minutes as needed for chest pain.     predniSONE 10 MG tablet  Commonly known as:  DELTASONE  - Prednisone dosing: Take  Prednisone 40mg  (4 tabs) x 3 days, then taper to 30mg  (3 tabs) x 3 days, then 20mg  (2 tabs) x 3days, then 10mg  (1  tab) x 3days, then OFF.  -   - Dispense:  30 tabs, refills: None  Start taking on:  10/05/2014     SPIRIVA HANDIHALER 18 MCG inhalation capsule  Generic drug:  tiotropium  PLACE 1 CAPSULE (18 MCG TOTAL) INTO INHALER AND INHALE DAILY.     SYSTANE OP  Place 1 drop into both eyes daily as needed (dry eye).         Brief H and P: For complete details please refer to admission H and P, but in brief DRAGAN TAMBURRINO is a 81 y.o. male who presents to the ED with chest discomfort around midnight. Patient has had a week long history of SOB as well. Increased cough with occasional small amount of bloody sputum. He had biopsy of lung on 1/14 for lung mass which he has not received the results for yet but has an appointment scheduled with Dr. Alen Blew on Tuesday of next week. In addition to the lung mass patient has had ongoing weight loss. Patient tried NTG at home with improvement in CP. ASA given en-route. EMS noted initial O2 sat in the mid 80s, improved with O2 via [place].  Hospital Course:   Acute respiratory failure with hypoxia/CAP with underlying history of metastatic disease to lung Currently improving, blood culture has been negative so far respiratory virus panel, sputum culture pending, urine strep antigen negative. Patient was placed on IV Solu-Medrol, scheduled bronchodilators, antitussives. Home O2 evaluation revealed 2 L O2 with ambulation -SATURATION QUALIFICATIONS: (This note is used to comply with regulatory documentation for home oxygen) Patient Saturations on Room Air at Rest = 91% Patient Saturations on Room Air while Ambulating = 88% patient will need to be discharged on home O2 would start him on 2 L O2 via [place] CT angiogram of the chest was negative for any pulmonary embolism, patient was discharged on a Zithromax, Ceftin for one week, O2 via nasal cannula with ambulation 2 L, prednisone with taper, Symbicort and albuterol inhaler  Sepsis?/Postobstructive  pneumonia Patient was placed on IV fluid hydration, blood cultures remained negative. Due to his initial complaint of shortness of breath, 2-D echo was done which showed EF of 55-60%.   Hemoptysis -Most likely secondary to patient's metastatic lung cancer, pneumonia, and recent biopsy  Adenocarcinoma - unclear primary site with metastatic disease to lung  - Biopsy results confirm that the lung mass is adenocarcinoma, likely an upper GI or pancreatobiliary primary.Patient is scheduled to see Dr.Shadad (oncology) on1/26/16   Constipation -Bisacodyl 5 mg BID -Continue Colace 100 mg  daily  Dysphagia -Speech; recommends Dysphagia 3 (Mechanical Soft);Thin liquid   Protein calorie malnutrition severe -Ensure TID between meals -Megace 800 mg daily    Day of Discharge BP 117/93 mmHg  Pulse 82  Temp(Src) 97.8 F (36.6 C) (Oral)  Resp 16  Ht 5\' 9"  (1.062 m)  Wt 56.246 kg (124 lb)  BMI 18.30 kg/m2  SpO2 95%  Physical Exam: General: Alert and awake oriented x3 not in any acute distress. CVS: S1-S2 clear no murmur rubs or gallops Chest: clear to auscultation bilaterally, no wheezing rales or rhonchi Abdomen: soft nontender, nondistended, normal bowel sounds Extremities: no cyanosis, clubbing or edema noted bilaterally    The results of significant diagnostics from this hospitalization (including imaging, microbiology, ancillary and laboratory) are listed below for reference.    LAB RESULTS: Basic Metabolic Panel:  Recent Labs Lab 10/01/14 0445 10/02/14 1445  NA 130* 131*  K 3.8 3.8  CL 94* 95*  CO2 26 29  GLUCOSE 133* 149*  BUN 8 8  CREATININE 0.71 0.77  CALCIUM 8.3* 8.5  MG  --  2.0   Liver Function Tests:  Recent Labs Lab 10/01/14 0445 10/02/14 1445  AST 34 52*  ALT 28 36  ALKPHOS 205* 304*  BILITOT 1.7* 1.0  PROT 6.1 5.9*  ALBUMIN 2.4* 2.3*   No results for input(s): LIPASE, AMYLASE in the last 168 hours. No results for input(s): AMMONIA in the last  168 hours. CBC:  Recent Labs Lab 10/01/14 0445 10/02/14 1445  WBC 11.4* 9.3  NEUTROABS 8.9* 7.2  HGB 12.0* 12.1*  HCT 34.7* 35.1*  MCV 91.1 91.4  PLT 264 282   Cardiac Enzymes:  Recent Labs Lab 10/01/14 1215 10/01/14 1754  TROPONINI 0.04* 0.03   BNP: Invalid input(s): POCBNP CBG: No results for input(s): GLUCAP in the last 168 hours.  Significant Diagnostic Studies:  Ct Angio Chest Pe W/cm &/or Wo Cm  10/01/2014   CLINICAL DATA:  Lower abdominal pain and nausea  EXAM: CT ANGIOGRAPHY CHEST  CT ABDOMEN AND PELVIS WITH CONTRAST  TECHNIQUE: Multidetector CT imaging of the chest was performed using the standard protocol during bolus administration of intravenous contrast. Multiplanar CT image reconstructions and MIPs were obtained to evaluate the vascular anatomy. Multidetector CT imaging of the abdomen and pelvis was performed using the standard protocol during bolus administration of intravenous contrast.  CONTRAST:  12mL OMNIPAQUE IOHEXOL 350 MG/ML SOLN  COMPARISON:  09/17/2014.  08/25/2014  FINDINGS: CTA CHEST FINDINGS  There are no filling defects in the pulmonary arterial tree to suggest acute pulmonary thromboembolism.  Atherosclerotic calcifications of the aortic arch and within the coronary arteries is stable.  No abnormal mediastinal adenopathy.  Innumerable pulmonary nodules bilaterally with confluent basilar mass effect is not significantly changed.  No pneumothorax or pleural effusion.  No definite acute rib fracture. Left anterior rib deformities have a chronic appearance. Stable thoracic spine with mid thoracic kyphosis.  CT ABDOMEN and PELVIS FINDINGS  Postcholecystectomy  Liver, spleen, kidneys, adrenal glands are within normal limits.  2.0 cm mass in the tail of the pancreas is stable.  Bladder and prostate are unremarkable.  Trace free fluid in the right hemipelvis.  Atherosclerotic changes of the visceral vasculature are noted. The celiac and SMA are grossly patent with  calcified plaque at the origin. Mild calcified and smooth plaque in the lower aorta. Iliac arteries are also grossly patent.  Lobulated densities to the left of the aorta are either non-opacified veins or small lymph nodes.  Sigmoid diverticulosis. No obvious evidence of acute diverticulitis.  Advanced degenerative disc disease at L5-S1.  Review of the MIP images confirms the above findings.  IMPRESSION: No evidence of acute pulmonary thromboembolism.  Bilateral pulmonary nodules with confluent mass effect at the lung bases are stable.  Stable mass in the tail of the pancreas.  Trace free fluid in the pelvis is nonspecific.   Electronically Signed   By: Maryclare Bean M.D.   On: 10/01/2014 07:22   Ct Abdomen Pelvis W Contrast  10/01/2014   CLINICAL DATA:  Lower abdominal pain and nausea  EXAM: CT ANGIOGRAPHY CHEST  CT ABDOMEN AND PELVIS WITH CONTRAST  TECHNIQUE: Multidetector CT imaging of the chest was performed using the standard protocol during bolus administration of intravenous contrast. Multiplanar CT image reconstructions and MIPs were obtained to evaluate the vascular anatomy. Multidetector CT imaging of the abdomen and pelvis was performed using the standard protocol during bolus administration of intravenous contrast.  CONTRAST:  165mL OMNIPAQUE IOHEXOL 350 MG/ML SOLN  COMPARISON:  09/17/2014.  08/25/2014  FINDINGS: CTA CHEST FINDINGS  There are no filling defects in the pulmonary arterial tree to suggest acute pulmonary thromboembolism.  Atherosclerotic calcifications of the aortic arch and within the coronary arteries is stable.  No abnormal mediastinal adenopathy.  Innumerable pulmonary nodules bilaterally with confluent basilar mass effect is not significantly changed.  No pneumothorax or pleural effusion.  No definite acute rib fracture. Left anterior rib deformities have a chronic appearance. Stable thoracic spine with mid thoracic kyphosis.  CT ABDOMEN and PELVIS FINDINGS  Postcholecystectomy  Liver,  spleen, kidneys, adrenal glands are within normal limits.  2.0 cm mass in the tail of the pancreas is stable.  Bladder and prostate are unremarkable.  Trace free fluid in the right hemipelvis.  Atherosclerotic changes of the visceral vasculature are noted. The celiac and SMA are grossly patent with calcified plaque at the origin. Mild calcified and smooth plaque in the lower aorta. Iliac arteries are also grossly patent.  Lobulated densities to the left of the aorta are either non-opacified veins or small lymph nodes.  Sigmoid diverticulosis. No obvious evidence of acute diverticulitis.  Advanced degenerative disc disease at L5-S1.  Review of the MIP images confirms the above findings.  IMPRESSION: No evidence of acute pulmonary thromboembolism.  Bilateral pulmonary nodules with confluent mass effect at the lung bases are stable.  Stable mass in the tail of the pancreas.  Trace free fluid in the pelvis is nonspecific.   Electronically Signed   By: Maryclare Bean M.D.   On: 10/01/2014 07:22   Dg Chest Port 1 View  10/01/2014   CLINICAL DATA:  Shortness of breath  EXAM: PORTABLE CHEST - 1 VIEW  COMPARISON:  09/23/2014  FINDINGS: Innumerable pulmonary nodules creating confluent opacities in the lower lungs is unchanged. No superimposed edema or pneumonia is identified, although an additional pathology could be easily obscured. There is no evidence for pleural effusion or pneumothorax. No cardiomegaly. Visible aortic contours are negative.  IMPRESSION: Diffuse pulmonary nodularity, confluent at the bases, is unchanged from prior. No evidence of acute superimposed disease.   Electronically Signed   By: Jorje Guild M.D.   On: 10/01/2014 05:12    2D ECHO: Study Conclusions  - Left ventricle: The cavity size was normal. Wall thickness was increased in a pattern of mild LVH. Systolic function was normal. The estimated ejection fraction was in the range of 55% to 60%. - Aortic valve: There was mild  regurgitation. -  Mitral valve: There was mild regurgitation. - Left atrium: The atrium was mildly dilated. - Atrial septum: No defect or patent foramen ovale was identified. - Pulmonary arteries: PA peak pressure: 33 mm Hg (S). - Impressions: Abnormal liver texture on subcostal images.  Disposition and Follow-up:     Discharge Instructions    Diet - low sodium heart healthy    Complete by:  As directed      Discharge instructions    Complete by:  As directed   MECHANICAL SOFT DIET     Increase activity slowly    Complete by:  As directed             DISPOSITION: Home with home health services  DISCHARGE FOLLOW-UP Follow-up Information    Follow up with [facility], MD On 10/05/2014.   Specialty:  Oncology   Why:  AT 3:30 PM   Contact information:   [place] [place] [address] [phone]       Follow up with Cathlean Cower, MD. Schedule an appointment as soon as possible for a visit in 2 weeks.   Specialties:  Internal Medicine, Radiology   Why:  for hospital follow-up   Contact information:   [place] [address] [phone]        Time spent on Discharge: 40 minutes  Signed:   Izamar Linden M.D. [facility] 10/04/2014, 1:25 PM Pager: [phone]

## 2014-10-04 NOTE — Progress Notes (Signed)
Visited with patient to assist with completing an Advanced Directives.  I assisted patient with completing form and Social Worker (donna and Evelena Peat will insure that form is notarized and copies made are made  for patient and chart.   10/04/14 1400  Clinical Encounter Type  Visited With Patient and family together;Health care provider  Visit Type Initial;Spiritual support  Referral From Nurse  Spiritual Encounters  Spiritual Needs Emotional  Stress Factors  Patient Stress Factors None identified  Family Stress Factors None identified  Advance Directives (For Healthcare)  Does patient have an advance directive? Yes  Jon Hicks, [place]

## 2014-10-04 NOTE — Progress Notes (Signed)
Speech Language Pathology Treatment: Dysphagia  Patient Details Name: Jon Hicks MRN: [id number] DOB: 03/26/1928 Today's Date: 10/04/2014 Time: 1150-1205 SLP Time Calculation (min) (ACUTE ONLY): 15 min  Assessment / Plan / Recommendation Clinical Impression  Goal of session was to determine pt tolerance of recommended dys 3/thin liquid diet. Pt was alert and cooperative. PO trials with solids and thin liquids did not exhibit s/s of aspiration. Pt revealed he frequently feels globus sensation when eating meals and he coughs up previously swallowed food. Pt coughing and regurgitating following PO trials with solids. Recommended pt take small bites and sips of water, moisten foods that appear dry and to sit upright in bed during meals. Also recommended pt sit upright in bed for 30 minutes following meals. Recommend pt continue dys 3/thin liquid diet. Recommend esophageal assessment.    HPI HPI: Jon Hicks is a 81 y.o. male who presents to the ED with chest discomfort around midnight.  Patient has had a week long history of SOB as well.  Increased cough with occasional small amount of bloody sputum.  He had biopsy of lung on 1/14 for lung mass which he has not received the results for yet but has an appointment scheduled with Dr. Alen Blew on Tuesday of next week..In addition to the lung mass patient has had ongoing unintentional weight loss.   Pertinent Vitals Pain Assessment: No/denies pain  SLP Plan  Continue with current plan of care    Recommendations Diet recommendations: Dysphagia 3 (mechanical soft);Thin liquid Liquids provided via: Cup;Straw Medication Administration: Crushed with puree Supervision: Patient able to self feed Compensations: Slow rate;Small sips/bites Postural Changes and/or Swallow Maneuvers: Seated upright 90 degrees;Upright 30-60 min after meal              Oral Care Recommendations: Oral care BID Follow up Recommendations: Other (comment) (To be  determined) Plan: Continue with current plan of care    GO     Jon Hicks 10/04/2014, 12:29 PM

## 2014-10-04 NOTE — Progress Notes (Signed)
SATURATION QUALIFICATIONS: (This note is used to comply with regulatory documentation for home oxygen)  Patient Saturations on Room Air at Rest = 92%  Patient Saturations on Room Air while Ambulating = 88%  Patient Saturations on 2 Liters of oxygen while Ambulating = 91%  Please briefly explain why patient needs home oxygen: Patients' oxygen saturation decreased to 88% without oxygen. Patient placed on oxygen and oxygen level increased to 91% on 2 L. His oxygen saturations have been 95-97% on 2 L at rest.

## 2014-10-04 NOTE — Evaluation (Signed)
Physical Therapy Evaluation Patient Details Name: Jon Hicks MRN: [id number] DOB: 05/09/1928 Today's Date: 10/04/2014   History of Present Illness  Pt adm with acute respiratory failure and found to have CA  likely an upper GI or pancreatobiliary primary with lung mets. PMH - DJD  Clinical Impression  Pt presents to PT with decr functional activity tolerance. Feel pt can benefit from rollator at home to improve mobility and for energy conservation. Will follow acutely but doubt will need follow up PT at DC.    Follow Up Recommendations No PT follow up    Equipment Recommendations  Other (comment) (Rollator)    Recommendations for Other Services       Precautions / Restrictions Precautions Precautions: None      Mobility  Bed Mobility Overal bed mobility: Modified Independent             General bed mobility comments: Incr time  Transfers Overall transfer level: Needs assistance Equipment used: 4-wheeled walker Transfers: Sit to/from Stand Sit to Stand: Supervision         General transfer comment: verbal cues for hand placement  Ambulation/Gait Ambulation/Gait assistance: Supervision Ambulation Distance (Feet): 200 Feet (200' x 1, 150' x 1) Assistive device: 4-wheeled walker Gait Pattern/deviations: Step-through pattern;Decreased step length - left;Decreased step length - right   Gait velocity interpretation: Below normal speed for age/gender General Gait Details: Pt adm on 2L of O2 with dyspnea 2/4 with amb. Initally amb without assistive device and pt slightly unsteady. Improved stability using rollator. Instructed pt in use of rollator including how to lock brakes and use seat.  Stairs            Wheelchair Mobility    Modified Rankin (Stroke Patients Only)       Balance Overall balance assessment: Needs assistance Sitting-balance support: No upper extremity supported;Feet supported Sitting balance-Leahy Scale: Normal     Standing  balance support: No upper extremity supported Standing balance-Leahy Scale: Good                               Pertinent Vitals/Pain Pain Assessment: No/denies pain    Home Living Family/patient expects to be discharged to:: Private residence Living Arrangements: Spouse/significant other Available Help at Discharge: Family Type of Home: House Home Access: Stairs to enter   Technical brewer of Steps: 2-3 Home Layout: One level Home Equipment: None      Prior Function Level of Independence: Independent               Hand Dominance        Extremity/Trunk Assessment   Upper Extremity Assessment: Generalized weakness           Lower Extremity Assessment: Generalized weakness         Communication   Communication: No difficulties  Cognition Arousal/Alertness: Awake/alert Behavior During Therapy: WFL for tasks assessed/performed Overall Cognitive Status: Within Functional Limits for tasks assessed                      General Comments      Exercises        Assessment/Plan    PT Assessment Patient needs continued PT services  PT Diagnosis Difficulty walking;Generalized weakness   PT Problem List Decreased activity tolerance;Decreased strength;Decreased balance;Decreased mobility;Decreased knowledge of use of DME;Cardiopulmonary status limiting activity  PT Treatment Interventions DME instruction;Balance training;Gait training;Functional mobility training;Patient/family education;Therapeutic activities;Therapeutic exercise   PT Goals (  Current goals can be found in the Care Plan section) Acute Rehab PT Goals Patient Stated Goal: Return home PT Goal Formulation: With patient Time For Goal Achievement: 10/11/14 Potential to Achieve Goals: Good    Frequency Min 3X/week   Barriers to discharge        Co-evaluation               End of Session Equipment Utilized During Treatment: Oxygen Activity Tolerance: Patient  limited by fatigue Patient left: in chair;with call bell/phone within reach Nurse Communication: Mobility status         Time: 5913-6859 PT Time Calculation (min) (ACUTE ONLY): 24 min   Charges:   PT Evaluation $Initial PT Evaluation Tier I: 1 Procedure PT Treatments $Gait Training: 8-22 mins   PT G Codes:        Karlo Goeden Oct 23, 2014, 11:24 AM  Suanne Marker PT [phone]

## 2014-10-05 ENCOUNTER — Ambulatory Visit (HOSPITAL_BASED_OUTPATIENT_CLINIC_OR_DEPARTMENT_OTHER): Payer: Medicare Other | Admitting: Oncology

## 2014-10-05 ENCOUNTER — Telehealth: Payer: Self-pay | Admitting: Oncology

## 2014-10-05 ENCOUNTER — Other Ambulatory Visit: Payer: Self-pay | Admitting: Physician Assistant

## 2014-10-05 ENCOUNTER — Telehealth: Payer: Self-pay | Admitting: *Deleted

## 2014-10-05 VITALS — BP 133/64 | HR 90 | Temp 98.3°F | Resp 21 | Ht 69.0 in | Wt 126.8 lb

## 2014-10-05 DIAGNOSIS — C7802 Secondary malignant neoplasm of left lung: Secondary | ICD-10-CM

## 2014-10-05 DIAGNOSIS — C7800 Secondary malignant neoplasm of unspecified lung: Secondary | ICD-10-CM

## 2014-10-05 DIAGNOSIS — C7801 Secondary malignant neoplasm of right lung: Secondary | ICD-10-CM

## 2014-10-05 DIAGNOSIS — C801 Malignant (primary) neoplasm, unspecified: Secondary | ICD-10-CM

## 2014-10-05 LAB — LEGIONELLA ANTIGEN, URINE

## 2014-10-05 LAB — CULTURE, RESPIRATORY

## 2014-10-05 LAB — CULTURE, RESPIRATORY W GRAM STAIN

## 2014-10-05 NOTE — Telephone Encounter (Signed)
Transition Care Management Follow-up Telephone Call D/C 10/04/14  How have you been since you were released from the hospital? Pt states he is making it, has felt better   Do you understand why you were in the hospital? YES   Do you understand the discharge instrcutions? YES  Items Reviewed:  Medications reviewed: Yes  Allergies reviewed: Yes  Dietary changes reviewed: mechanical soft & thin liquid  Referrals reviewed: No referral needed   Functional Questionnaire:   Activities of Daily Living (ADLs):   He states he are independent in the following: ambulation, bathing and hygiene, feeding, continence, toileting and dressing He states he get around doesn't need assistance    Any transportation issues/concerns?: NO   Any patient concerns? NO   Confirmed importance and date/time of follow-up visits scheduled: Yes, pt has made appt for 10/14/14 w/Dr. Jenny Reichmann advise pt to keep appt   Confirmed with patient if condition begins to worsen call PCP or go to the ER.

## 2014-10-05 NOTE — Telephone Encounter (Signed)
Gave avs & calendar for March. °

## 2014-10-05 NOTE — Progress Notes (Signed)
Spoke with Jon Hicks, referral made to [facility] of [facility], dr Alen Blew to be the attending. Hospice physicians to do symptom management and have DNR signed.

## 2014-10-05 NOTE — Progress Notes (Signed)
Hematology and Oncology Follow Up Visit  Jon Hicks [id number] 11/14/1927 81 y.o. 10/05/2014 4:22 PM    Principle Diagnosis: 81 year old gentleman with:  1.  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 This was diagnosed in January 2016.  2. 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 His M spike is about 1 g/dL, IgG level slightly elevated at 1860. 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   Current therapy: Observation and surveillance.   Interim History: Jon Hicks today for a follow-up visit with his son. He is a gentleman that I have followed in the past for MGUS but most recently developed metastatic adenocarcinoma. Since his last visit he  Developed shortness of breath last month and his evaluation including chest x-ray and subsequently imaging studies showed bilateral pulmonary nodules.  A PET scan done on 09/17/2014 showed pulmonary nodules  Noted bilaterally with mild hypermetabolic activity noted in the pancreatic tail.  He underwent a biopsy  On 09/24/2014 by interventional radiology which confirmed the presence of adenocarcinoma consistent with likely upper GI source. Clinically, he has been doing poorly. He was hospitalized  On 10/01/2014 with symptoms of chest pain and shortness of breath. He was discharged  On 10/04/2014 on oxygen. Since his discharge, he has very poor performance status and for the most part  In bed with limited mobility. He has poor by mouth intake been reasonably comfortable pulmonary breathing Jon Hicks standpoint.But does report some fatigue and tiredness. He did not have any opportunistic infection.  He is no longer reporting any chest pain but does report dyspnea on exertion. He does not report any palpitation but does report  leg edema. He does not report any nausea, vomiting her abdominal pain but does report dysphagia. He does not report any  frequency urgency or hesitancy. He does report constipation. His performance status and quality of life is very limited and have deteriorated rapidly. Rest of his review of systems unremarkable.  Medications: I have reviewed the patient's current medications.  Current Outpatient Prescriptions  Medication Sig Dispense Refill  . acetaminophen-codeine (TYLENOL #3) 300-30 MG per tablet Take 1 tablet by mouth every 4 (four) hours as needed. (Patient taking differently: Take 1 tablet by mouth every 4 (four) hours as needed for moderate pain. ) 60 tablet 0  . albuterol (PROVENTIL HFA;VENTOLIN HFA) 108 (90 BASE) MCG/ACT inhaler Inhale 2 puffs into the lungs every 6 (six) hours as needed for wheezing or shortness of breath. 1 Inhaler 11  . aspirin EC 81 MG EC tablet Take 1 tablet (81 mg total) by mouth daily. 30 tablet 3  . atorvastatin (LIPITOR) 10 MG tablet TAKE 1 TABLET  BY MOUTH DAILY AT 6 PM. 90 tablet 3  . azithromycin (ZITHROMAX) 500 MG tablet Take 1 tablet (500 mg total) by mouth daily. X 1WEEK 7 tablet 0  . budesonide-formoterol (SYMBICORT) 160-4.5 MCG/ACT inhaler Inhale 2 puffs into the lungs 2 (two) times daily. 1 Inhaler 12  . cefUROXime (CEFTIN) 500 MG tablet Take 1 tablet (500 mg total) by mouth 2 (two) times daily with a meal. 14 tablet 0  . chlorpheniramine-HYDROcodone (TUSSIONEX) 10-8 MG/5ML LQCR Take 5 mLs by mouth every 12 (twelve) hours as needed for cough. 115 mL 0  . cholecalciferol (VITAMIN D) 400 UNITS TABS tablet Take 2,000 Units by mouth daily.     . clobetasol (TEMOVATE) 0.05 % cream Apply 1 application topically daily as needed (  psoriasis).     Marland Kitchen desonide (DESOWEN) 0.05 % cream Apply 1 application topically 2 (two) times daily as needed (rash).     Marland Kitchen dextromethorphan-guaiFENesin (MUCINEX DM) 30-600 MG per 12 hr tablet Take 1 tablet by mouth 2 (two) times daily as needed for cough.    . docusate sodium (COLACE) 100 MG capsule Take 100 mg by mouth daily as needed for mild  constipation.    . fluocinonide cream (LIDEX) 0.25 % Apply 1 application topically 2 (two) times daily as needed (psoriasis).    . fluticasone (FLONASE) 50 MCG/ACT nasal spray Place 2 sprays into both nostrils 2 (two) times daily as needed for allergies.     . Fluticasone-Salmeterol (ADVAIR DISKUS) 250-50 MCG/DOSE AEPB Inhale 1 puff into the lungs 2 (two) times daily. 1 each 11  . hydrocortisone 2.5 % cream Apply 1 application topically as directed.    . loratadine (CLARITIN) 10 MG tablet Take 10 mg by mouth daily as needed for allergies.     . megestrol (MEGACE) 400 MG/10ML suspension Take 20 mLs (800 mg total) by mouth daily. 240 mL 3  . Multiple Vitamins-Minerals (ICAPS AREDS FORMULA PO) Take by mouth daily.    . nitroGLYCERIN (NITROSTAT) 0.4 MG SL tablet Place 1 tablet (0.4 mg total) under the tongue every 5 (five) minutes as needed for chest pain. 25 tablet 3  . predniSONE (DELTASONE) 10 MG tablet Prednisone dosing: Take  Prednisone 40mg  (4 tabs) x 3 days, then taper to 30mg  (3 tabs) x 3 days, then 20mg  (2 tabs) x 3days, then 10mg  (1 tab) x 3days, then OFF.  Dispense:  30 tabs, refills: None 30 tablet 0  . SPIRIVA HANDIHALER 18 MCG inhalation capsule PLACE 1 CAPSULE (18 MCG TOTAL) INTO INHALER AND INHALE DAILY. 30 capsule 11   No current facility-administered medications for this visit.    Allergies: No Known Allergies  Past Medical History, Surgical history, Social history, and Family History were reviewed and updated.   Physical Exam: Blood pressure 133/64, pulse 90, temperature 98.3 F (36.8 C), temperature source Oral, resp. rate 21, height 5\' 9"  (1.753 m), weight 126 lb 12.8 oz (57.516 kg), SpO2 96 %. ECOG: 3 General appearance: alert,  Chronically ill-appearing not in any distress. Head: Normocephalic, without obvious abnormality, atraumatic Neck: no adenopathy, no thyroid masses. Lymph nodes: Cervical, supraclavicular, and axillary nodes normal. Heart:regular rate and  rhythm, S1, S2 normal, no murmur, click, rub or gallop Lung:chest clear  With expiratory wheezes noted. Abdomin: soft, non-tender, without masses or organomegaly EXT: 1+ edema noted.   Lab Results: Lab Results  Component Value Date   WBC 9.3 10/02/2014   HGB 12.1* 10/02/2014   HCT 35.1* 10/02/2014   MCV 91.4 10/02/2014   PLT 282 10/02/2014     Chemistry      Component Value Date/Time   NA 131* 10/02/2014 1445   NA 140 01/27/2014 1429   K 3.8 10/02/2014 1445   K 4.2 01/27/2014 1429   CL 95* 10/02/2014 1445   CL 103 01/28/2013 1444   CO2 29 10/02/2014 1445   CO2 25 01/27/2014 1429   BUN 8 10/02/2014 1445   BUN 12.0 01/27/2014 1429   CREATININE 0.77 10/02/2014 1445   CREATININE 0.8 01/27/2014 1429      Component Value Date/Time   CALCIUM 8.5 10/02/2014 1445   CALCIUM 8.6 01/27/2014 1429   ALKPHOS 304* 10/02/2014 1445   ALKPHOS 115 01/27/2014 1429   AST 52* 10/02/2014 1445   AST 19  01/27/2014 1429   ALT 36 10/02/2014 1445   ALT 14 01/27/2014 1429   BILITOT 1.0 10/02/2014 1445   BILITOT 0.48 01/27/2014 1429       Impression and Plan: This is a pleasant 81 year old gentleman with the  following issues.  1. A monoclonal protein in the form of IgG kappa by immunofixation likely represents MGUS.  This is not an issue for him at this time.  2.  Metastatic adenocarcinoma likely of a pancreatic etiology. The natural course of this disease was discussed with the patient and son. He is certainly has stage IV disease with very limited treatment options. Palliative chemotherapy is the only option that we can consider. Complications of chemotherapy that include nausea, vomiting, myelosuppression, neutropenia, neutropenic sepsis were discussed with the benefit would be marginal. Given his poor performance status and debilitated state the risks of chemotherapy outweighs any potential minor benefit. I do not think is a good candidate for palliative chemotherapy.   Given these  options, I have offered him supportive care only and hospice enrollment and he is agreeable to that.   3. Prognosis: his prognosis is very poor with very limited life expectancy. I shared with him that I  Think he qualifies for hospice services with expectation of living 6 months or less.  All questions were answered today.   Zola Button, MD 1/26/20164:22 PM

## 2014-10-06 ENCOUNTER — Encounter: Payer: Self-pay | Admitting: Oncology

## 2014-10-06 LAB — RESPIRATORY VIRUS PANEL
Adenovirus: NEGATIVE
Influenza A: NEGATIVE
Influenza B: NEGATIVE
Metapneumovirus: NEGATIVE
PARAINFLUENZA 2 A: NEGATIVE
PARAINFLUENZA 3 A: NEGATIVE
Parainfluenza 1: NEGATIVE
RESPIRATORY SYNCYTIAL VIRUS A: NEGATIVE
Respiratory Syncytial Virus B: NEGATIVE
Rhinovirus: NEGATIVE

## 2014-10-06 NOTE — Progress Notes (Signed)
Put son's fmla form in registration desk

## 2014-10-09 LAB — CULTURE, BLOOD (ROUTINE X 2): CULTURE: NO GROWTH

## 2014-10-10 LAB — CULTURE, BLOOD (ROUTINE X 2): Culture: NO GROWTH

## 2014-10-13 LAB — AFB CULTURE WITH SMEAR (NOT AT ARMC)
ACID FAST SMEAR: NONE SEEN
Special Requests: NORMAL

## 2014-10-14 ENCOUNTER — Inpatient Hospital Stay: Payer: Medicare Other | Admitting: Internal Medicine

## 2014-10-18 ENCOUNTER — Telehealth: Payer: Self-pay | Admitting: Cardiovascular Disease

## 2014-10-18 NOTE — Telephone Encounter (Signed)
New message      Pt was diagnosed with liver/pancreas cancer.  He is under hospice care.  His cancer doctor told him to stop lipitor.  Patient want to know if it is ok with Dr Burt Knack to stop lipitor.

## 2014-10-18 NOTE — Telephone Encounter (Signed)
I spoke with the pt's son and made him aware that the pt can stop Lipitor.

## 2014-10-18 NOTE — Addendum Note (Signed)
Addended by: Barkley Boards on: 10/18/2014 11:13 AM   Modules accepted: Orders, Medications

## 2014-10-18 NOTE — Telephone Encounter (Signed)
Yes this is fine. Sorry to hear about his diagnosis. thx

## 2014-10-20 ENCOUNTER — Encounter: Payer: Self-pay | Admitting: Podiatry

## 2014-10-20 ENCOUNTER — Ambulatory Visit (INDEPENDENT_AMBULATORY_CARE_PROVIDER_SITE_OTHER): Payer: Medicare Other | Admitting: Podiatry

## 2014-10-20 VITALS — BP 124/69 | HR 91 | Resp 18

## 2014-10-20 DIAGNOSIS — M79676 Pain in unspecified toe(s): Secondary | ICD-10-CM | POA: Diagnosis not present

## 2014-10-20 DIAGNOSIS — B351 Tinea unguium: Secondary | ICD-10-CM

## 2014-10-20 NOTE — Progress Notes (Signed)
   Subjective:    Patient ID: Jon Hicks, male    DOB: Dec 22, 1927, 81 y.o.   MRN: [id number]  HPI   81 year old male presents the office today with combined to painful, elongated toenails particularly with shoe and pressure. Denies any recent redness or drainage from the nail sites. No other complaints at this time.   Review of Systems  HENT: Positive for trouble swallowing.   Respiratory: Positive for shortness of breath.        Difficulty breathing  Gastrointestinal: Positive for constipation.  Genitourinary: Positive for difficulty urinating.  Skin:       Change in nails  Neurological: Positive for weakness and light-headedness.  Hematological: Bruises/bleeds easily.  All other systems reviewed and are negative.      Objective:   Physical Exam AAO 3, NAD DP/PT pulses palpable, CRT less than 3 seconds Protective sensation intact with Simms Weinstein monofilament, Achilles tendon reflex intact. Nails are hypertrophic, dystrophic, elongated, brittle, discolored 10. No swelling erythema or drainage from the nail sites. There is subjective tenderness upon palpation of the toenails 10. No open lesions or pre-ulcer lesions identified. No interdigital maceration. No other areas of tenderness to bilateral lower semis. No edema, erythema, increase in warmth. No pain with calf compression, sling, warmth, erythema.       Assessment & Plan:  81 year old male symptomatic onychomycosis -Nail sharply debrided 10 without complication/bleeding. -Follow-up in 3 months or sooner if any problems are to arise. In the meantime, encouraged to call the office with a questions, concerns, change in symptoms.

## 2014-10-26 ENCOUNTER — Telehealth: Payer: Self-pay

## 2014-10-26 ENCOUNTER — Encounter: Payer: Self-pay | Admitting: Podiatry

## 2014-10-26 NOTE — Telephone Encounter (Signed)
Abigail Butts from hospice called to let us know that pt is going into [facility] for a 3 night respite. The dates are 17th, 18th, 19th. The pt is declining and will likely go to [facility] next week. His wife is in [place] as well so he will be seeing her. This is an informational call.

## 2014-10-27 ENCOUNTER — Telehealth: Payer: Self-pay | Admitting: *Deleted

## 2014-10-27 NOTE — Telephone Encounter (Signed)
Cleotis Nipper, hospice nurse called to update patient information. Patient is going to be placed in [facility]. MD informed.

## 2014-11-01 ENCOUNTER — Telehealth: Payer: Self-pay | Admitting: Oncology

## 2014-11-01 NOTE — Telephone Encounter (Signed)
Received death certificate 02/07/04

## 2014-11-09 ENCOUNTER — Ambulatory Visit: Payer: Medicare Other | Admitting: Oncology

## 2014-11-09 DEATH — deceased

## 2014-11-17 ENCOUNTER — Ambulatory Visit: Payer: Medicare Other | Admitting: Internal Medicine

## 2015-01-19 ENCOUNTER — Ambulatory Visit: Payer: Medicare Other | Admitting: Podiatry

## 2015-01-28 ENCOUNTER — Other Ambulatory Visit: Payer: Medicare Other

## 2015-01-28 ENCOUNTER — Ambulatory Visit: Payer: Medicare Other | Admitting: Oncology

## 2015-07-14 IMAGING — PT NM PET TUM IMG INITIAL (PI) SKULL BASE T - THIGH
8 series · 25 of 25 positions shown · non-contrast
Comparison: Chest CT of 08/25/2014.

CLINICAL DATA: Initial treatment strategy for Chest CT
demonstrating perilymphatic pulmonary nodules, most consistent with
metastatic disease.. No known history of primary malignancy.

EXAM:
NUCLEAR MEDICINE PET SKULL BASE TO THIGH
TECHNIQUE: 6.4 mCi F-18 FDG was injected intravenously. Full-ring PET imaging
was performed from the skull base to thigh after the radiotracer. CT
data was obtained and used for attenuation correction and anatomic
localization.
FASTING BLOOD GLUCOSE:  Value: 103 mg/dl

[Series 3: pet sk_thigh ac · axial · 5.0mm · 4.07mm/px · z∈[-1334,-430]mm · 4 of 227 slices shown]
[im 1/227]
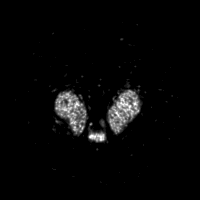
[im 76/227]
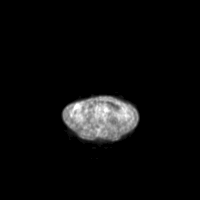
[im 151/227]
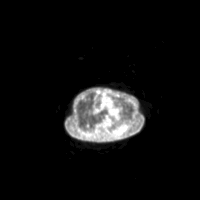
[im 227/227]
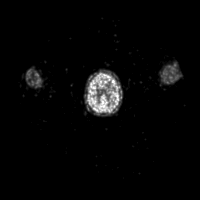

[Series 4: ct sk_thigh 5.0 b31f · axial · 5.0mm · 0.87mm/px · z∈[-1334,-430]mm · 5 of 227 slices shown]
[im 1/227]
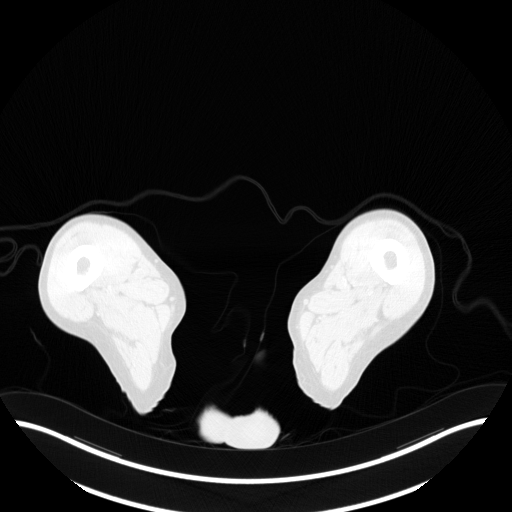
[im 57/227]
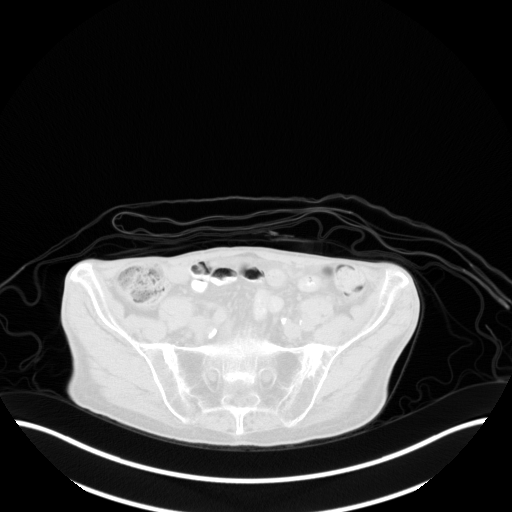
[im 114/227]
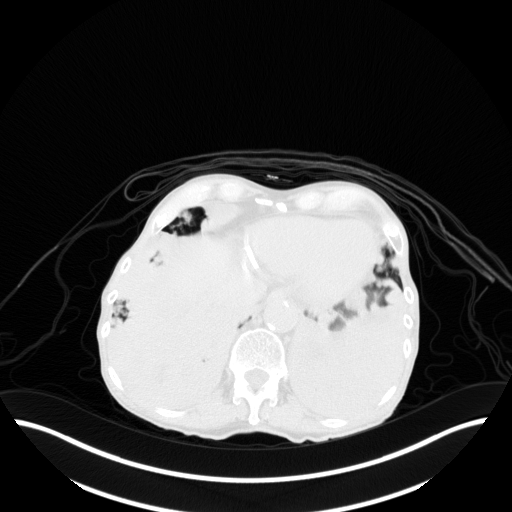
[im 170/227]
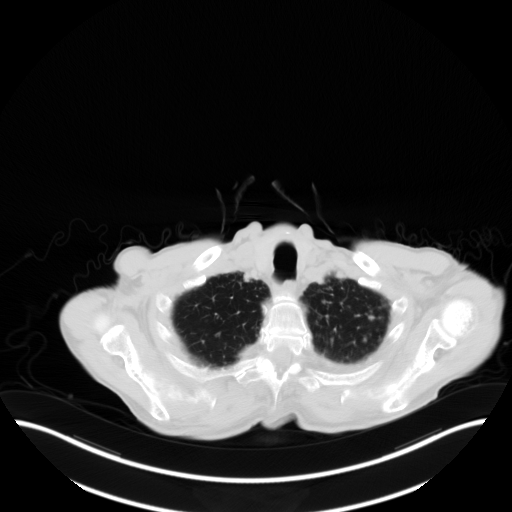
[im 227/227  brain]
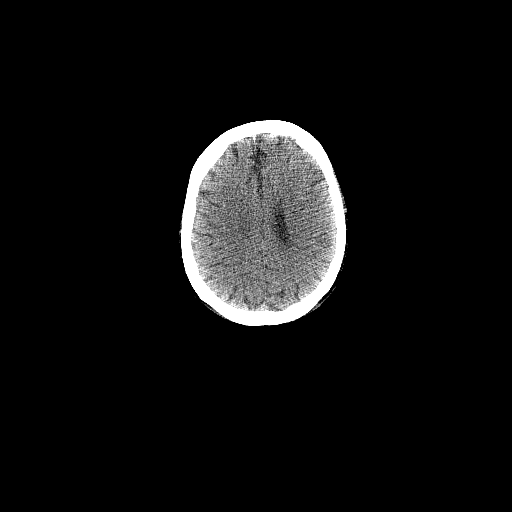

[Series 6: ct sk_thigh 5.0 b70f (id)_bone · axial · 5.0mm · 0.64mm/px · z∈[-906,-606]mm · 2 of 76 slices shown]
[im 1/76  bone]
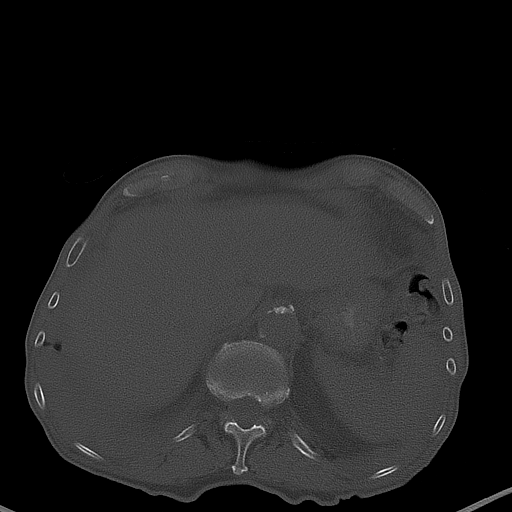
[im 76/76  bone]
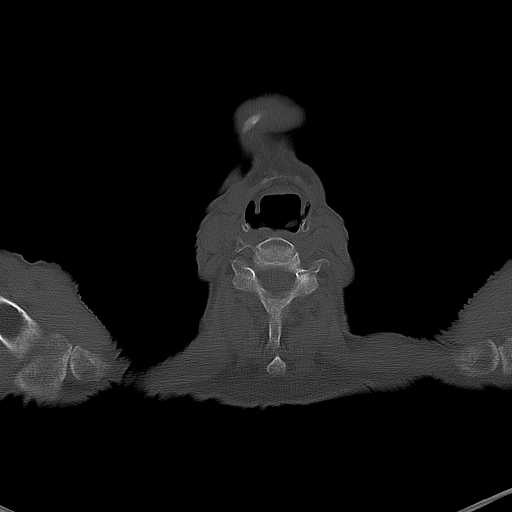

[Series 8: pet sk_thigh nac · axial · 5.0mm · 4.07mm/px · z∈[-1334,-430]mm · 5 of 227 slices shown]
[im 1/227]
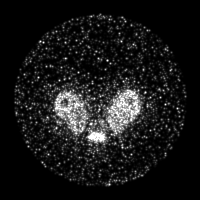
[im 57/227]
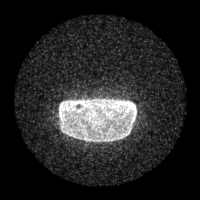
[im 114/227]
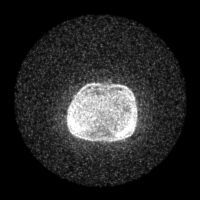
[im 170/227]
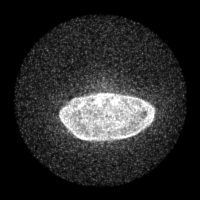
[im 227/227]
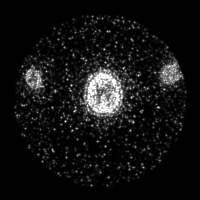

[Series 604: mip collection<mip range> · coronal · 1.88mm/px · 1 of 32 slices shown]
[im 1/32]
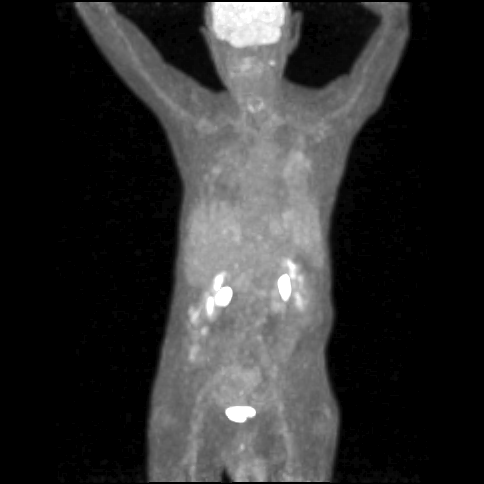

[Series 605: range-ac ct sk_thigh 5.0 hd_fov-cor-<alpha range> · 2 of 80 slices shown]
[im 1/80]
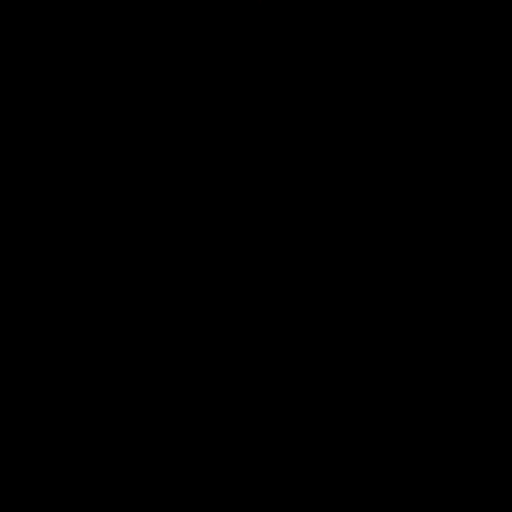
[im 80/80]
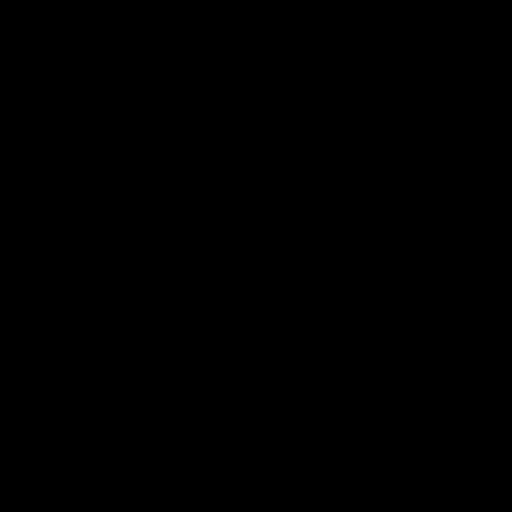

[Series 606: range-ac ct sk_thigh 5.0 hd_fov-tra-<alpha range> · 5 of 220 slices shown]
[im 1/220]
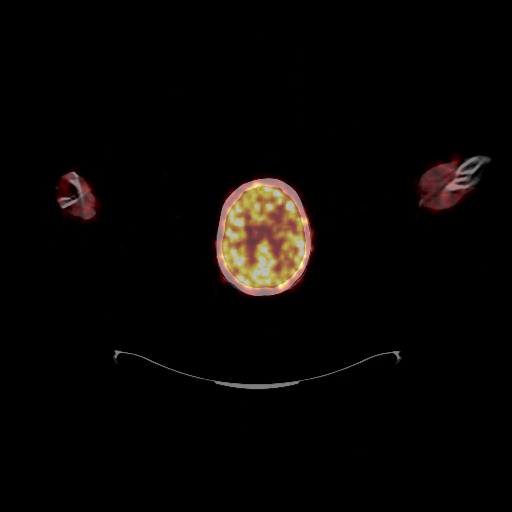
[im 55/220]
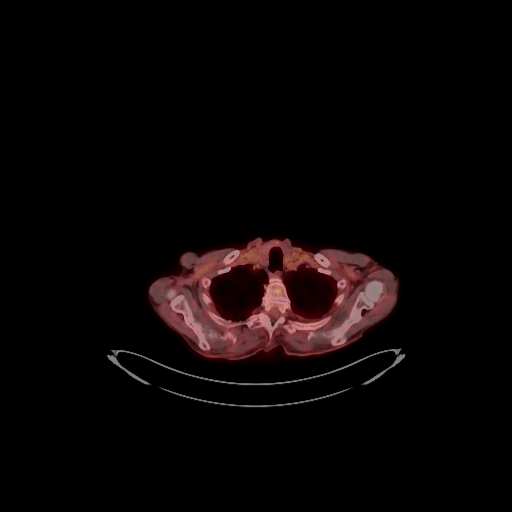
[im 110/220]
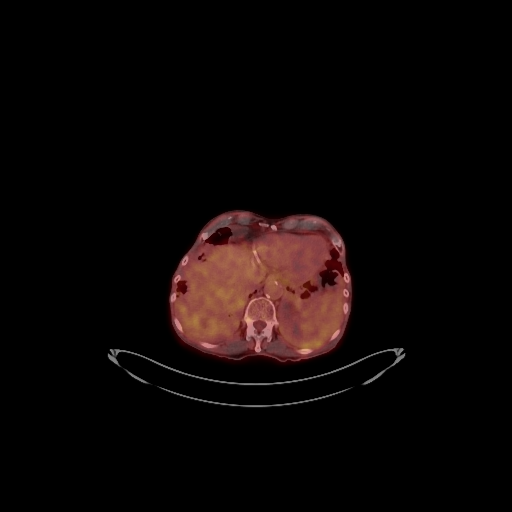
[im 165/220]
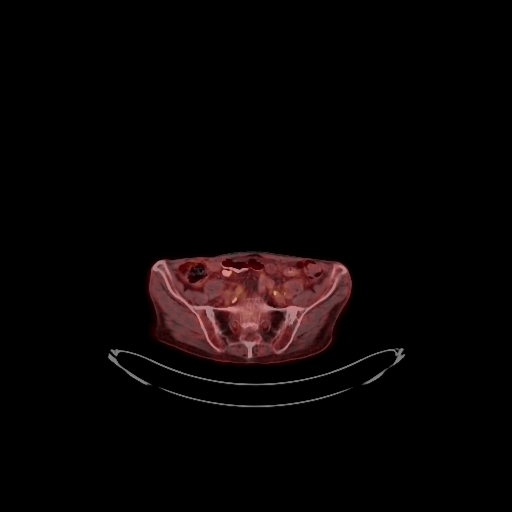
[im 220/220]
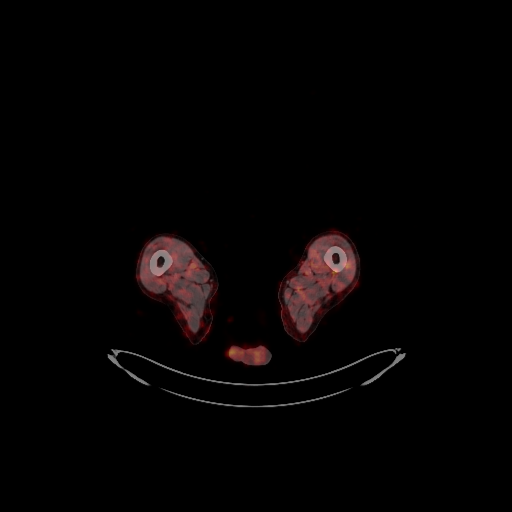

[Series 1028: results mm oncology reading · 4.0mm · 0.97mm/px · 1 of 4 slices shown]
[im 1/4]
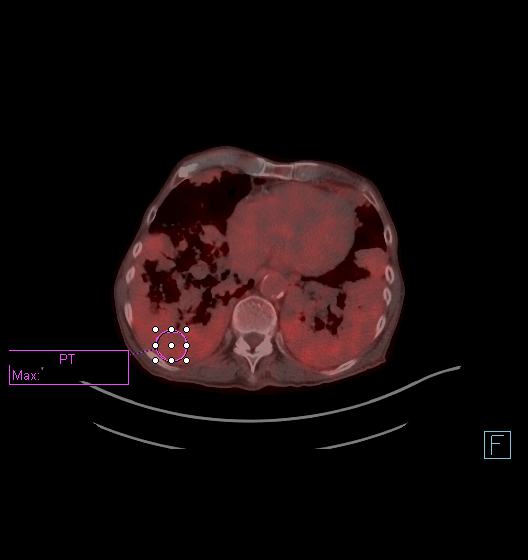

[25 of 25 positions shown; findings below may reference images not displayed]

FINDINGS: NECK

A focus of hypermetabolism about the central portion of the left
parotid gland. This area is poorly evaluated on CT secondary to beam
hardening artifact from dental hardware. This measures a S.U.V. max
of 4.3, including on image 30 of series 4. Equivocal nodule in this
area.

No cervical adenopathy.

CHEST

Low-level hypermetabolism which corresponds to lower lobe
predominant pulmonary nodules and masses. Example dense right base
masslike opacity which measures a S.U.V. max of 2.8, including on
image 59 of series 6.

No abnormal activity within thoracic nodes.

ABDOMEN/PELVIS

Mild hypermetabolism corresponding to the subtle soft tissue
fullness described on the prior CT within the pancreatic tail. This
measures 2.0 x 1.7 cm and a S.U.V. max of 2.6 on image 126 of series
4. No abnormal nodal activity within the abdomen or pelvis.

SKELETON

No abnormal marrow activity.

CT IMAGES PERFORMED FOR ATTENUATION CORRECTION

Bilateral carotid atherosclerosis. Chest findings deferred to recent
diagnostic CT. Atherosclerosis, including within the coronary
arteries. Cholecystectomy. Pancreatic atrophy. Mildly prominent
retroperitoneal nodes. None are pathologic by size criteria. Mild
prostatomegaly. Trace cul-de-sac fluid. Tiny fat containing right
inguinal hernia. Moderate osteopenia.
IMPRESSION: 1. Relatively low-level hypermetabolism corresponding to innumerable
pulmonary nodules and masses. Most consistent with metastatic
disease.
2. Mild hypermetabolism corresponding to soft tissue fullness within
the pancreatic tail. Considerations include primary pancreatic
neoplasm (including possibly islet cell) or intrapancreatic
splenule. Consider dedicated pre and post contrast abdominal MRI.
3. Left parotid hypermetabolism, suspicious for primary parotid
neoplasm. This could be further evaluated with contrast-enhanced
face/neck CT. This is unlikely to be the source of the widespread
pulmonary metastasis.
